# Patient Record
Sex: Male | Born: 1960 | Race: White | Hispanic: No | Marital: Married | Smoking: Never smoker
Health system: Southern US, Community
[De-identification: ages and names within clinical notes are randomized; demographics above are authoritative.]

## PROBLEM LIST (undated history)

## (undated) DIAGNOSIS — I639 Cerebral infarction, unspecified: Secondary | ICD-10-CM

## (undated) DIAGNOSIS — N529 Male erectile dysfunction, unspecified: Secondary | ICD-10-CM

## (undated) DIAGNOSIS — E785 Hyperlipidemia, unspecified: Secondary | ICD-10-CM

## (undated) DIAGNOSIS — F32A Depression, unspecified: Secondary | ICD-10-CM

## (undated) DIAGNOSIS — F419 Anxiety disorder, unspecified: Secondary | ICD-10-CM

## (undated) DIAGNOSIS — I10 Essential (primary) hypertension: Secondary | ICD-10-CM

## (undated) HISTORY — PX: NO PAST SURGERIES: SHX2092

## (undated) HISTORY — DX: Hyperlipidemia, unspecified: E78.5

## (undated) HISTORY — DX: Male erectile dysfunction, unspecified: N52.9

## (undated) HISTORY — DX: Depression, unspecified: F32.A

## (undated) HISTORY — DX: Anxiety disorder, unspecified: F41.9

---

## 2004-08-29 ENCOUNTER — Ambulatory Visit: Payer: Self-pay | Admitting: Gastroenterology

## 2009-10-11 ENCOUNTER — Ambulatory Visit: Payer: Self-pay | Admitting: Gastroenterology

## 2012-05-06 ENCOUNTER — Other Ambulatory Visit: Payer: Self-pay | Admitting: Neurology

## 2012-05-06 LAB — COMPREHENSIVE METABOLIC PANEL
Albumin: 3.6 g/dL (ref 3.4–5.0)
Alkaline Phosphatase: 58 U/L (ref 50–136)
Anion Gap: 6 — ABNORMAL LOW (ref 7–16)
BUN: 17 mg/dL (ref 7–18)
Calcium, Total: 9 mg/dL (ref 8.5–10.1)
Co2: 29 mmol/L (ref 21–32)
Creatinine: 0.84 mg/dL (ref 0.60–1.30)
Glucose: 94 mg/dL (ref 65–99)
Potassium: 5 mmol/L (ref 3.5–5.1)
SGOT(AST): 16 U/L (ref 15–37)
SGPT (ALT): 28 U/L (ref 12–78)
Sodium: 141 mmol/L (ref 136–145)

## 2012-05-12 ENCOUNTER — Ambulatory Visit: Payer: Self-pay | Admitting: Neurology

## 2012-05-14 ENCOUNTER — Ambulatory Visit: Payer: Self-pay | Admitting: Neurology

## 2012-05-20 ENCOUNTER — Ambulatory Visit: Payer: Self-pay | Admitting: Neurology

## 2012-05-20 LAB — CREATININE, SERUM
Creatinine: 0.94 mg/dL (ref 0.60–1.30)
EGFR (Non-African Amer.): >60

## 2014-09-08 ENCOUNTER — Ambulatory Visit: Payer: Self-pay | Admitting: Gastroenterology

## 2016-03-10 ENCOUNTER — Emergency Department: Payer: BLUE CROSS/BLUE SHIELD

## 2016-03-10 ENCOUNTER — Emergency Department
Admission: EM | Admit: 2016-03-10 | Discharge: 2016-03-11 | Disposition: A | Discharge status: Other Acute Inpatient Hospital | Payer: BLUE CROSS/BLUE SHIELD | Attending: Emergency Medicine | Admitting: Emergency Medicine

## 2016-03-10 ENCOUNTER — Encounter: Payer: Self-pay | Admitting: Emergency Medicine

## 2016-03-10 DIAGNOSIS — Z79899 Other long term (current) drug therapy: Secondary | ICD-10-CM | POA: Insufficient documentation

## 2016-03-10 DIAGNOSIS — Z7982 Long term (current) use of aspirin: Secondary | ICD-10-CM | POA: Diagnosis not present

## 2016-03-10 DIAGNOSIS — Z5181 Encounter for therapeutic drug level monitoring: Secondary | ICD-10-CM | POA: Insufficient documentation

## 2016-03-10 DIAGNOSIS — I10 Essential (primary) hypertension: Secondary | ICD-10-CM | POA: Insufficient documentation

## 2016-03-10 DIAGNOSIS — R4182 Altered mental status, unspecified: Secondary | ICD-10-CM | POA: Insufficient documentation

## 2016-03-10 DIAGNOSIS — Z8673 Personal history of transient ischemic attack (TIA), and cerebral infarction without residual deficits: Secondary | ICD-10-CM | POA: Insufficient documentation

## 2016-03-10 DIAGNOSIS — F410 Panic disorder [episodic paroxysmal anxiety] without agoraphobia: Secondary | ICD-10-CM

## 2016-03-10 DIAGNOSIS — F322 Major depressive disorder, single episode, severe without psychotic features: Secondary | ICD-10-CM

## 2016-03-10 HISTORY — DX: Cerebral infarction, unspecified: I63.9

## 2016-03-10 HISTORY — DX: Essential (primary) hypertension: I10

## 2016-03-10 LAB — DIFFERENTIAL
BASOS PCT: 1 %
Basophils Absolute: 0 10*3/uL (ref 0–0.1)
Eosinophils Absolute: 0.1 10*3/uL (ref 0–0.7)
Eosinophils Relative: 1 %
Lymphocytes Relative: 31 %
Lymphs Abs: 2.8 10*3/uL (ref 1.0–3.6)
MONO ABS: 0.8 10*3/uL (ref 0.2–1.0)
Monocytes Relative: 9 %
NEUTROS ABS: 5.3 10*3/uL (ref 1.4–6.5)
NEUTROS PCT: 58 %

## 2016-03-10 LAB — COMPREHENSIVE METABOLIC PANEL
ALBUMIN: 4.4 g/dL (ref 3.5–5.0)
ALT: 43 U/L (ref 17–63)
ANION GAP: 11 (ref 5–15)
AST: 31 U/L (ref 15–41)
Alkaline Phosphatase: 52 U/L (ref 38–126)
BUN: 11 mg/dL (ref 6–20)
CHLORIDE: 104 mmol/L (ref 101–111)
CO2: 25 mmol/L (ref 22–32)
CREATININE: 0.9 mg/dL (ref 0.61–1.24)
Calcium: 9.1 mg/dL (ref 8.9–10.3)
GFR calc non Af Amer: >60 mL/min (ref >60)
Glucose, Bld: 100 mg/dL — ABNORMAL HIGH (ref 65–99)
Potassium: 3.5 mmol/L (ref 3.5–5.1)
SODIUM: 140 mmol/L (ref 135–145)
Total Bilirubin: 1.3 mg/dL — ABNORMAL HIGH (ref 0.3–1.2)
Total Protein: 7.5 g/dL (ref 6.5–8.1)

## 2016-03-10 LAB — URINALYSIS COMPLETE WITH MICROSCOPIC (ARMC ONLY)
BILIRUBIN URINE: NEGATIVE
Bacteria, UA: NONE SEEN
Glucose, UA: NEGATIVE mg/dL
HGB URINE DIPSTICK: NEGATIVE
Nitrite: NEGATIVE
PH: 6 (ref 5.0–8.0)
Protein, ur: NEGATIVE mg/dL
SPECIFIC GRAVITY, URINE: 1.013 (ref 1.005–1.030)

## 2016-03-10 LAB — CBC
HCT: 44.2 % (ref 40.0–52.0)
Hemoglobin: 15.6 g/dL (ref 13.0–18.0)
MCH: 35 pg — ABNORMAL HIGH (ref 26.0–34.0)
MCHC: 35.2 g/dL (ref 32.0–36.0)
MCV: 99.5 fL (ref 80.0–100.0)
PLATELETS: 264 10*3/uL (ref 150–440)
RBC: 4.44 MIL/uL (ref 4.40–5.90)
RDW: 13.1 % (ref 11.5–14.5)
WBC: 9.1 10*3/uL (ref 3.8–10.6)

## 2016-03-10 LAB — TROPONIN I: Troponin I: <0.03 ng/mL (ref <0.03)

## 2016-03-10 LAB — PROTIME-INR
INR: 1.01
PROTHROMBIN TIME: 13.3 s (ref 11.4–15.2)

## 2016-03-10 LAB — GLUCOSE, CAPILLARY: GLUCOSE-CAPILLARY: 101 mg/dL — AB (ref 65–99)

## 2016-03-10 LAB — APTT: APTT: 27 s (ref 24–36)

## 2016-03-10 MED ORDER — ASPIRIN 325 MG PO TABS: 325.0000 mg | ORAL_TABLET | Freq: Every day | ORAL | Status: DC

## 2016-03-10 MED ORDER — HYDROCHLOROTHIAZIDE 12.5 MG PO CAPS
ORAL_CAPSULE | ORAL | Status: AC
Start: 2016-03-10 — End: 2016-03-10
  Administered 2016-03-10: 12.5 mg via ORAL
  Filled 2016-03-10: qty 1

## 2016-03-10 MED ORDER — LISINOPRIL 10 MG PO TABS
10.0000 mg | ORAL_TABLET | Freq: Every day | ORAL | Status: DC
  Administered 2016-03-10: 10 mg via ORAL

## 2016-03-10 MED ORDER — FLUOXETINE HCL 20 MG PO CAPS
20.0000 mg | ORAL_CAPSULE | Freq: Every day | ORAL | Status: DC
  Administered 2016-03-10: 20 mg via ORAL

## 2016-03-10 MED ORDER — LISINOPRIL 10 MG PO TABS
ORAL_TABLET | ORAL | Status: AC
  Administered 2016-03-10: 10 mg via ORAL
  Filled 2016-03-10: qty 1

## 2016-03-10 MED ORDER — ATORVASTATIN CALCIUM 20 MG PO TABS: 20.0000 mg | ORAL_TABLET | Freq: Every day | ORAL | Status: DC

## 2016-03-10 MED ORDER — ASPIRIN EC 325 MG PO TBEC
DELAYED_RELEASE_TABLET | ORAL | Status: AC
  Administered 2016-03-10: 325 mg via ORAL
  Filled 2016-03-10: qty 1

## 2016-03-10 MED ORDER — FLUOXETINE HCL 20 MG PO CAPS
ORAL_CAPSULE | ORAL | Status: AC
Start: 2016-03-10 — End: 2016-03-10
  Administered 2016-03-10: 20 mg via ORAL
  Filled 2016-03-10: qty 1

## 2016-03-10 MED ORDER — LORAZEPAM 2 MG PO TABS: 2.0000 mg | ORAL_TABLET | Freq: Four times a day (QID) | ORAL | Status: DC | PRN

## 2016-03-10 MED ORDER — ASPIRIN EC 325 MG PO TBEC
325.0000 mg | DELAYED_RELEASE_TABLET | Freq: Every day | ORAL | Status: DC
  Administered 2016-03-10: 325 mg via ORAL

## 2016-03-10 MED ORDER — FOSFOMYCIN TROMETHAMINE 3 G PO PACK
3.0000 g | PACK | Freq: Once | ORAL | Status: AC
  Administered 2016-03-10: 3 g via ORAL
  Filled 2016-03-10: qty 3

## 2016-03-10 MED ORDER — HYDROCHLOROTHIAZIDE 12.5 MG PO CAPS
12.5000 mg | ORAL_CAPSULE | Freq: Every day | ORAL | Status: DC
  Administered 2016-03-10: 12.5 mg via ORAL

## 2016-03-10 NOTE — ED Notes (Signed)
Per Family LKW was xcouple days ago but similar symptoms happened xmonth ago

## 2016-03-10 NOTE — ED Notes (Signed)
Pt given urinal for UA before leaving for MRI, pt unable to urinate at this time

## 2016-03-10 NOTE — ED Notes (Signed)
Patient transported to MRI 

## 2016-03-10 NOTE — ED Notes (Addendum)
Pt states he is feeling better. 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 He denies SI/HI at this time.

## 2016-03-10 NOTE — BH Assessment (Signed)
Tele Assessment Note   Daniel Warren is an 55 y.o. male, Caucasian, Married who presents to [facility]  for altered mental status, per family pt has been disoriented , syncopa xcouple days. Pt has baseline left side deficits from previous stroke but increased weakness to left side. Patient states that his primary complaint is of weakness and heart racing which he states may be anxiety/ panic attacks, but unsure.Patinet states that when he gets mad or angry he gets weak and "blacks out." However, pt denies hx. Of anger or violence. 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 Patient states that he has had no change in sleep and regularly sleeps from 4-5 hours per night as norms for farm work per patient report. Patient denies any hx. Of psychotic symptoms.  Patient denies current or past SI/HI and AVH as well as substance abuse. Patient denies any hx. Of inpatient or outpatient psychiatric care.    Patient is dressed in scrubs and is alert and oriented x4. Patient speech was within normal limits and motor behavior appeared normal. Patient thought process is coherent. Patient  does not appear to be responding to internal stimuli. Patient was cooperative throughout the assessment.   Diagnosis: Unspecified Anxiety Disorder  Past Medical History:  Past Medical History:  Diagnosis Date  . Hypertension   . Stroke [facility])     History reviewed. No pertinent surgical history.  Family History: History reviewed. No pertinent family history.  Social History:  reports that he has never smoked. He has never used smokeless tobacco. He reports that he does not drink alcohol or use drugs.  Additional Social History:     CIWA: CIWA-Ar BP: 122/84 Pulse Rate: 78 COWS:    PATIENT STRENGTHS: (choose at least two) Active sense of humor Average or above average  intelligence Capable of independent living  Allergies:  Allergies  Allergen Reactions  . Percocet [Oxycodone-Acetaminophen] Other (See Comments)    hallucinations  . Codeine Rash and Other (See Comments)    hallucinations    Home Medications:  (Not in a hospital admission)  OB/GYN Status:  No LMP for male patient.  General Assessment Data Location of Assessment: [facility] ED TTS Assessment: In system Is this a Tele or Face-to-Face Assessment?: Face-to-Face Is this an Initial Assessment or a Re-assessment for this encounter?: Initial Assessment Marital status: Married Daniel Warren name: n/a Is patient pregnant?: No Pregnancy Status: No Living Arrangements: Spouse/significant other Can pt return to current living arrangement?: Yes Admission Status: Voluntary Is patient capable of signing voluntary admission?: Yes Referral Source: Self/Family/Friend Insurance type: Nurse, adult Exam [facility] Walk-in ONLY) Medical Exam completed: Yes  Crisis Care Plan Living Arrangements: Spouse/significant other Legal Guardian: Other: (none) Name of Psychiatrist: none Name of Therapist: none  Education Status Is patient currently in school?: No Current Grade: n/a Highest grade of school patient has completed: unspecified Name of school: unspecified Contact person: none given  Risk to self with the past 6 months Suicidal Ideation: No Has patient been a risk to self within the past 6 months prior to admission? : No Suicidal Intent: No Has patient had any suicidal intent within the past 6 months prior to admission? : No Is patient at risk for suicide?: No Suicidal Plan?: No Has patient had any suicidal plan within the past 6 months prior to  admission? : No Access to Means: No What has been your use of drugs/alcohol within the last 12 months?: none Previous Attempts/Gestures: No How many times?: 0 Other Self Harm Risks: none noted Triggers for Past Attempts: None  known Intentional Self Injurious Behavior: None Family Suicide History: No Recent stressful life event(s): Trauma (Comment) (multiple deaths in family) Persecutory voices/beliefs?: No Depression: Yes Depression Symptoms: Despondent, Insomnia, Tearfulness, Fatigue, Guilt Substance abuse history and/or treatment for substance abuse?: No Suicide prevention information given to non-admitted patients: Not applicable  Risk to Others within the past 6 months Homicidal Ideation: No Does patient have any lifetime risk of violence toward others beyond the six months prior to admission? : No Thoughts of Harm to Others: No Current Homicidal Intent: No Current Homicidal Plan: No Access to Homicidal Means: No Identified Victim: none History of harm to others?: No Assessment of Violence: None Noted Violent Behavior Description: none Does patient have access to weapons?: Yes (Comment) (pt lives on farm has guns to protect against wild animals) Criminal Charges Pending?: No Does patient have a court date: No Is patient on probation?: No  Psychosis Hallucinations: None noted Delusions: None noted  Mental Status Report Appearance/Hygiene: In scrubs Eye Contact: Good Motor Activity: Unremarkable Speech: Unremarkable Level of Consciousness: Alert Mood: Anxious Affect: Appropriate to circumstance Anxiety Level: Panic Attacks Panic attack frequency:  (random) Most recent panic attack: 03/10/16 Thought Processes: Coherent, Relevant Judgement: Unimpaired Orientation: Person, Place, Time, Situation, Appropriate for developmental age Obsessive Compulsive Thoughts/Behaviors: None  Cognitive Functioning Concentration: Normal Memory: Recent Intact, Remote Intact IQ: Average Insight: Good Impulse Control: Good Appetite: Fair Weight Loss: 0 Weight Gain: 0 Sleep: No Change Total Hours of Sleep:  (4-6) Vegetative Symptoms: None  ADLScreening [facility]) Patient's cognitive  ability adequate to safely complete daily activities?: Yes Patient able to express need for assistance with ADLs?: Yes Independently performs ADLs?: Yes (appropriate for developmental age)  Prior Inpatient Therapy Prior Inpatient Therapy: No Prior Therapy Dates: n/a Prior Therapy Facilty/Provider(s): n/a Reason for Treatment: n/a  Prior Outpatient Therapy Prior Outpatient Therapy: No Prior Therapy Dates: n/a Prior Therapy Facilty/Provider(s): n/a Reason for Treatment: n/a Does patient have an ACCT team?: No Does patient have Intensive In-House Services?  : No Does patient have [facility] services? : No Does patient have P4CC services?: No  ADL Screening (condition at time of admission) Patient's cognitive ability adequate to safely complete daily activities?: Yes Is the patient deaf or have difficulty hearing?: No Does the patient have difficulty seeing, even when wearing glasses/contacts?: No Does the patient have difficulty concentrating, remembering, or making decisions?: No Patient able to express need for assistance with ADLs?: Yes Does the patient have difficulty dressing or bathing?: No Independently performs ADLs?: Yes (appropriate for developmental age) Does the patient have difficulty walking or climbing stairs?: No Weakness of Legs: None Weakness of Arms/Hands: None       Abuse/Neglect Assessment (Assessment to be complete while patient is alone) Physical Abuse: Denies Verbal Abuse: Denies Sexual Abuse: Denies Exploitation of patient/patient's resources: Denies Self-Neglect: Denies Values / Beliefs Cultural Requests During Hospitalization: None Spiritual Requests During Hospitalization: None   Advance Directives (For Healthcare) Does patient have an advance directive?: No Would patient like information on creating an advanced directive?: No - patient declined information    Additional Information 1:1 In Past 12 Months?: No CIRT Risk: No Elopement Risk:  No Does patient have medical clearance?: Yes     Disposition:  Disposition Initial Assessment Completed for this  Encounter: Yes Disposition of Patient: Other dispositions (TBD upon psych consult)  Hipolito Bayley 03/10/2016 2:46 PM

## 2016-03-10 NOTE — ED Triage Notes (Signed)
Pt to ED via POV for altered mental status, per family pt has been disoriented , syncopa xcouple days. Pt has baseline left side deficits from previous stroke but increased weakness to left side. . Pt c/o left sided headache. Pt A&O to self and time.

## 2016-03-10 NOTE — Progress Notes (Signed)
Patient is to be admitted to [facility] by Dr. Toni Amend.  Attending Physician will be Dr. Ardyth Harps.   Patient has been assigned to room 304, by [facility] Charge Nurse Marylu Lund.   Intake Paper Work has been signed and placed on patient chart.  ER staff is aware of the admission ( [facility].; Dr. Don Perking, ER MD; [place] Patient's Nurse & [facility]essel[facility] Patient Access). Peggie Hornak K. Sherlon Handing, LPC-A, [facility]  Counselor 03/10/2016 6:34 PM

## 2016-03-10 NOTE — Consult Note (Signed)
[facility] Consult   Reason for Consult:  This is a consult for 55 year old man with a history of anxiety brought into the hospital with what sounds like panic attack Referring Physician:  Alfred Levins Patient Identification: LORIMER TIBERIO MRN:  [id number] Principal Diagnosis: Severe major depression, single episode, without psychotic features [facility]) Diagnosis:   Patient Active Problem List   Diagnosis Date Noted  . Severe major depression, single episode, without psychotic features ([place]) [F32.2] 03/10/2016  . Panic attacks [F41.0] 03/10/2016  . History of stroke [Z86.73] 03/10/2016  . Hypertension [I10] 03/10/2016  . Suicidal behavior [F48.9] 03/10/2016    Total Time spent with patient: 1 hour  Subjective:   Daniel Warren is a 55 y.o. male patient admitted with "I reckon anxiety".  HPI:  Patient interviewed. Chart reviewed. Labs and vitals reviewed. Commitment paperwork reviewed. This 55 year old man was brought into the hospital after having some sort of breakdown last night. On history the patient describes it to me that he was out in the barn when he started to feel strange and like he was falling apart. He managed to drive his truck back to the house but his left side was feeling weak in his heart was pounding. Family brought him into the hospital with concerns that he might have had a stroke. According to the commitment paper however the patient also pulled a gun out of the closet and was pointing it at himself last night. When I confronted him with this information he admitted that it was true but defended himself by saying the gun was not loaded. I ask him repeatedly if he could explain the situation to me and he told me "just wasn't thinking". Patient admits that he's been feeling overwhelmed recently. He has financial problems and also a lot of worry about his extended family. His sleep has been interrupted frequently. 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 His appetite apparently has been okay. This is not the first anxiety attack-like situation he has had this week in fact it's the third or fourth. Mood is been getting more stressed out recently. Denies having any hallucinations. He is not currently getting any kind of psychiatric medicine. He denies the abuse of alcohol or drugs.  Social history: Patient lives with his wife and 2 adult sons. 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 Feels very stressed by this and also by worries about his mother-in-law.  Medical history: History of atherosclerotic disease and had a stroke documented about 4 years ago. Ever since then he has felt anxious and worried about his health. History of hypertension.  Substance abuse history: Denies alcohol or drug abuse denies any past history of alcohol and drug abuse. Eventually he relents and admits that he drinks about 2-3 beers a day although he says it's rarely anymore than that and he insists that it's never been a problem.  Past Psychiatric History: Never seen a psychiatrist. Never been prescribed any medicine for mental health conditions. He denies any suicide attempts although the commitment paperwork states this is the second time in the last month he has pulled a gun out on himself.  Risk to Self: Suicidal Ideation: No Suicidal Intent: No Is patient at risk for suicide?: No Suicidal Plan?: No Access to Means: No What has been your use of  drugs/alcohol within the last 12 months?: none How many times?: 0 Other Self Harm Risks: none noted Triggers for Past Attempts: None known Intentional Self Injurious Behavior: None Risk to Others: Homicidal Ideation: No Thoughts of Harm to Others: No Current Homicidal Intent: No Current Homicidal Plan: No Access to Homicidal Means: No Identified Victim: none History of harm to others?:  No Assessment of Violence: None Noted Violent Behavior Description: none Does patient have access to weapons?: Yes (Comment) (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  : No Does patient have [facility] services? : No Does patient have P4CC services?: No  Past Medical History:  Past Medical History:  Diagnosis Date  . Hypertension   . Stroke [facility])    History reviewed. No pertinent surgical history. Family History: History reviewed. No pertinent family history. Family Psychiatric  History: Patient denies knowing of any family history of mental health problems at all. Social History:  History  Alcohol Use No     History  Drug Use No    Social History   Social History  . Marital status: Married    Spouse name: N/A  . Number of children: N/A  . Years of education: N/A   Social History Main Topics  . Smoking status: Never Smoker  . Smokeless tobacco: Never Used  . Alcohol use No  . Drug use: No  . Sexual activity: Not Asked   Other Topics Concern  . None   Social History Narrative  . None   Additional Social History:    Allergies:   Allergies  Allergen Reactions  . Percocet [Oxycodone-Acetaminophen] Other (See Comments)    hallucinations  . Codeine Rash and Other (See Comments)    hallucinations    Labs:  Results for orders placed or performed during the hospital encounter of 03/10/16 (from the past 48 hour(s))  Protime-INR     Status: None   Collection Time: 03/10/16  8:14 AM  Result Value Ref Range    Prothrombin Time 13.3 11.4 - 15.2 seconds   INR 1.01   APTT     Status: None   Collection Time: 03/10/16  8:14 AM  Result Value Ref Range   aPTT 27 24 - 36 seconds  CBC     Status: Abnormal   Collection Time: 03/10/16  8:14 AM  Result Value Ref Range   WBC 9.1 3.8 - 10.6 K/uL   RBC 4.44 4.40 - 5.90 MIL/uL   Hemoglobin 15.6 13.0 - 18.0 g/dL   HCT 44.2 40.0 - 52.0 %   MCV 99.5 80.0 - 100.0 fL   MCH 35.0 (H) 26.0 - 34.0 pg   MCHC 35.2 32.0 - 36.0 g/dL   RDW 13.1 11.5 - 14.5 %   Platelets 264 150 - 440 K/uL  Differential     Status: None   Collection Time: 03/10/16  8:14 AM  Result Value Ref Range   Neutrophils Relative % 58 %   Neutro Abs 5.3 1.4 - 6.5 K/uL   Lymphocytes Relative 31 %   Lymphs Abs 2.8 1.0 - 3.6 K/uL   Monocytes Relative 9 %  Monocytes Absolute 0.8 0.2 - 1.0 K/uL   Eosinophils Relative 1 %   Eosinophils Absolute 0.1 0 - 0.7 K/uL   Basophils Relative 1 %   Basophils Absolute 0.0 0 - 0.1 K/uL  Comprehensive metabolic panel     Status: Abnormal   Collection Time: 03/10/16  8:14 AM  Result Value Ref Range   Sodium 140 135 - 145 mmol/L   Potassium 3.5 3.5 - 5.1 mmol/L   Chloride 104 101 - 111 mmol/L   CO2 25 22 - 32 mmol/L   Glucose, Bld 100 (H) 65 - 99 mg/dL   BUN 11 6 - 20 mg/dL   Creatinine, Ser 0.90 0.61 - 1.24 mg/dL   Calcium 9.1 8.9 - 10.3 mg/dL   Total Protein 7.5 6.5 - 8.1 g/dL   Albumin 4.4 3.5 - 5.0 g/dL   AST 31 15 - 41 U/L   ALT 43 17 - 63 U/L   Alkaline Phosphatase 52 38 - 126 U/L   Total Bilirubin 1.3 (H) 0.3 - 1.2 mg/dL   GFR calc non Af Amer >60 >60 mL/min   GFR calc Af Amer >60 >60 mL/min    Comment: (NOTE) The eGFR has been calculated using the CKD EPI equation. This calculation has not been validated in all clinical situations. eGFR's persistently <60 mL/min signify possible Chronic Kidney Disease.    Anion gap 11 5 - 15  Troponin I     Status: None   Collection Time: 03/10/16  8:14 AM  Result Value Ref Range   Troponin I <0.03  <0.03 ng/mL  Glucose, capillary     Status: Abnormal   Collection Time: 03/10/16  8:21 AM  Result Value Ref Range   Glucose-Capillary 101 (H) 65 - 99 mg/dL  Urinalysis complete, with microscopic ([facility] only)     Status: Abnormal   Collection Time: 03/10/16 11:21 AM  Result Value Ref Range   Color, Urine YELLOW (A) YELLOW   APPearance CLEAR (A) CLEAR   Glucose, UA NEGATIVE NEGATIVE mg/dL   Bilirubin Urine NEGATIVE NEGATIVE   Ketones, ur TRACE (A) NEGATIVE mg/dL   Specific Gravity, Urine 1.013 1.005 - 1.030   Hgb urine dipstick NEGATIVE NEGATIVE   pH 6.0 5.0 - 8.0   Protein, ur NEGATIVE NEGATIVE mg/dL   Nitrite NEGATIVE NEGATIVE   Leukocytes, UA 1+ (A) NEGATIVE   RBC / HPF 0-5 0 - 5 RBC/hpf   WBC, UA 6-30 0 - 5 WBC/hpf   Bacteria, UA NONE SEEN NONE SEEN   Squamous Epithelial / LPF 0-5 (A) NONE SEEN   Mucous PRESENT     Current Facility-Administered Medications  Medication Dose Route Frequency Provider Last Rate Last Dose  . aspirin tablet 325 mg  325 mg Oral Daily Gonzella Lex, MD      . Derrill Memo ON 03/11/2016] atorvastatin (LIPITOR) tablet 20 mg  20 mg Oral q1800 Gonzella Lex, MD      . FLUoxetine (PROZAC) capsule 20 mg  20 mg Oral Daily Pegge Cumberledge T Elsy Chiang, MD      . hydrochlorothiazide (MICROZIDE) capsule 12.5 mg  12.5 mg Oral Daily Allyse Fregeau T Bruna Dills, MD      . lisinopril (PRINIVIL,ZESTRIL) tablet 10 mg  10 mg Oral Daily Gonzella Lex, MD      . LORazepam (ATIVAN) tablet 2 mg  2 mg Oral Q6H PRN Gonzella Lex, MD       Current Outpatient Prescriptions  Medication Sig Dispense Refill  . aspirin (GOODSENSE ASPIRIN) 325  MG tablet Take 325 mg by mouth daily.    Marland Kitchen atorvastatin (LIPITOR) 20 MG tablet Take 20 mg by mouth daily.    Marland Kitchen lisinopril-hydrochlorothiazide (PRINZIDE,ZESTORETIC) 10-12.5 MG tablet Take 1 tablet by mouth daily.    Marland Kitchen Ubiquinol 100 MG CAPS Take 1 capsule by mouth daily.       Musculoskeletal: Strength & Muscle Tone: within normal limits Gait & Station:  normal Patient leans: N/A  Psychiatric Specialty Exam: Physical Exam  Nursing note and vitals reviewed. Constitutional: He appears well-developed and well-nourished.  HENT:  Head: Normocephalic and atraumatic.  Eyes: Conjunctivae are normal. Pupils are equal, round, and reactive to light.  Neck: Normal range of motion.  Cardiovascular: Regular rhythm and normal heart sounds.   Respiratory: Effort normal. No respiratory distress.  GI: Soft.  Musculoskeletal: Normal range of motion.  Neurological: He is alert.  Skin: Skin is warm and dry.  Psychiatric: His mood appears anxious. His speech is delayed. He is slowed. He expresses inappropriate judgment. He exhibits a depressed mood. He expresses suicidal ideation. He exhibits normal recent memory.    Review of Systems  Constitutional: Negative.   HENT: Negative.   Eyes: Negative.   Respiratory: Negative.   Cardiovascular: Negative.   Gastrointestinal: Negative.   Musculoskeletal: Negative.   Skin: Negative.   Neurological: Negative.   Psychiatric/Behavioral: Positive for depression and suicidal ideas. Negative for hallucinations, memory loss and substance abuse. The patient is nervous/anxious and has insomnia.     Blood pressure 122/84, pulse 78, temperature 98 F (36.7 C), temperature source Oral, resp. rate 19, height 5' 9"  (1.753 m), weight 86.2 kg (190 lb), SpO2 96 %.Body mass index is 28.06 kg/m.  General Appearance: Casual  Eye Contact:  Fair  Speech:  Clear and Coherent  Volume:  Normal  Mood:  Anxious and Depressed  Affect:  Constricted  Thought Process:  Disorganized  Orientation:  Full (Time, Place, and Person)  Thought Content:  Illogical  Suicidal Thoughts:  Yes.  without intent/plan  Homicidal Thoughts:  No  Memory:  Immediate;   Good Recent;   Fair Remote;   Fair  Judgement:  Impaired  Insight:  Shallow  Psychomotor Activity:  Decreased  Concentration:  Concentration: Poor  Recall:  [employer] of  Knowledge:  Fair  Language:  Fair  Akathisia:  No  Handed:  Right  AIMS (if indicated):     Assets:  Financial Resources/Insurance Housing Social Support Talents/Skills  ADL's:  Intact  Cognition:  WNL  Sleep:        Treatment Plan Summary: Daily contact with patient to assess and evaluate symptoms and progress in treatment, Medication management and Plan 55 year old man who came in with panic attack. Initially he did not report the more significant symptom of pulling out a gun on himself. When confronted however he admits it. Patient clearly is very distressed. He is showing poor insight and judgment regarding his mental health situation. Very nervous. Appears to be having a significant major depression as well as panic attacks. The 2 episodes of putting his hands on a gun in an emotional situation vastly increases my concern of dangerousness. Patient will be admitted to the psychiatric ward. Ativan as needed for anxiety. Initiate Prozac for anxiety disorder and depression. Supportive counseling. 15 minute checks. Case reviewed with the ER doctor.  Disposition: Recommend psychiatric Inpatient admission when medically cleared. Supportive therapy provided about ongoing stressors.  Alethia Berthold, MD 03/10/2016 6:17 PM
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
## 2016-03-11 ENCOUNTER — Inpatient Hospital Stay
Admission: EM | Admit: 2016-03-11 | Discharge: 2016-03-14 | DRG: 885 | Disposition: A | Discharge status: Home / Self Care | Payer: BLUE CROSS/BLUE SHIELD | Source: Intra-hospital | Attending: Psychiatry | Admitting: Psychiatry

## 2016-03-11 DIAGNOSIS — N390 Urinary tract infection, site not specified: Secondary | ICD-10-CM | POA: Diagnosis present

## 2016-03-11 DIAGNOSIS — I679 Cerebrovascular disease, unspecified: Secondary | ICD-10-CM

## 2016-03-11 DIAGNOSIS — Z79899 Other long term (current) drug therapy: Secondary | ICD-10-CM | POA: Diagnosis not present

## 2016-03-11 DIAGNOSIS — I10 Essential (primary) hypertension: Secondary | ICD-10-CM | POA: Diagnosis present

## 2016-03-11 DIAGNOSIS — G4700 Insomnia, unspecified: Secondary | ICD-10-CM | POA: Diagnosis present

## 2016-03-11 DIAGNOSIS — F410 Panic disorder [episodic paroxysmal anxiety] without agoraphobia: Secondary | ICD-10-CM | POA: Diagnosis present

## 2016-03-11 DIAGNOSIS — Z888 Allergy status to other drugs, medicaments and biological substances status: Secondary | ICD-10-CM

## 2016-03-11 DIAGNOSIS — F17220 Nicotine dependence, chewing tobacco, uncomplicated: Secondary | ICD-10-CM | POA: Diagnosis present

## 2016-03-11 DIAGNOSIS — Z7982 Long term (current) use of aspirin: Secondary | ICD-10-CM

## 2016-03-11 DIAGNOSIS — F322 Major depressive disorder, single episode, severe without psychotic features: Secondary | ICD-10-CM | POA: Diagnosis present

## 2016-03-11 DIAGNOSIS — Z820 Family history of epilepsy and other diseases of the nervous system: Secondary | ICD-10-CM

## 2016-03-11 DIAGNOSIS — E785 Hyperlipidemia, unspecified: Secondary | ICD-10-CM

## 2016-03-11 DIAGNOSIS — I69354 Hemiplegia and hemiparesis following cerebral infarction affecting left non-dominant side: Secondary | ICD-10-CM | POA: Diagnosis not present

## 2016-03-11 DIAGNOSIS — Z886 Allergy status to analgesic agent status: Secondary | ICD-10-CM

## 2016-03-11 LAB — URINE DRUG SCREEN, QUALITATIVE (ARMC ONLY)
Amphetamines, Ur Screen: NOT DETECTED
BARBITURATES, UR SCREEN: NOT DETECTED
BENZODIAZEPINE, UR SCRN: NOT DETECTED
Cannabinoid 50 Ng, Ur ~~LOC~~: NOT DETECTED
Cocaine Metabolite,Ur ~~LOC~~: NOT DETECTED
MDMA (Ecstasy)Ur Screen: NOT DETECTED
METHADONE SCREEN, URINE: NOT DETECTED
OPIATE, UR SCREEN: NOT DETECTED
Phencyclidine (PCP) Ur S: NOT DETECTED
TRICYCLIC, UR SCREEN: NOT DETECTED

## 2016-03-11 LAB — TSH: TSH: 1.727 u[IU]/mL (ref 0.350–4.500)

## 2016-03-11 LAB — VITAMIN B12: Vitamin B-12: 284 pg/mL (ref 180–914)

## 2016-03-11 MED ORDER — ATORVASTATIN CALCIUM 20 MG PO TABS
20.0000 mg | ORAL_TABLET | Freq: Every day | ORAL | Status: DC
  Administered 2016-03-11 – 2016-03-14 (×4): 20 mg via ORAL
  Filled 2016-03-11 (×4): qty 1

## 2016-03-11 MED ORDER — LISINOPRIL 10 MG PO TABS
10.0000 mg | ORAL_TABLET | Freq: Every day | ORAL | Status: DC
  Administered 2016-03-11 – 2016-03-14 (×4): 10 mg via ORAL
  Filled 2016-03-11 (×4): qty 1

## 2016-03-11 MED ORDER — ACETAMINOPHEN 325 MG PO TABS: 650.0000 mg | ORAL_TABLET | Freq: Four times a day (QID) | ORAL | Status: DC | PRN

## 2016-03-11 MED ORDER — MAGNESIUM HYDROXIDE 400 MG/5ML PO SUSP: 30.0000 mL | Freq: Every day | ORAL | Status: DC | PRN

## 2016-03-11 MED ORDER — TRAZODONE HCL 50 MG PO TABS
50.0000 mg | ORAL_TABLET | Freq: Every day | ORAL | Status: DC
  Administered 2016-03-11: 50 mg via ORAL
  Filled 2016-03-11: qty 1

## 2016-03-11 MED ORDER — UBIQUINOL 100 MG PO CAPS: 1.0000 | ORAL_CAPSULE | Freq: Every day | ORAL | Status: DC

## 2016-03-11 MED ORDER — ASPIRIN 325 MG PO TABS
325.0000 mg | ORAL_TABLET | Freq: Every day | ORAL | Status: DC
  Administered 2016-03-11 – 2016-03-14 (×4): 325 mg via ORAL
  Filled 2016-03-11 (×5): qty 1

## 2016-03-11 MED ORDER — LISINOPRIL-HYDROCHLOROTHIAZIDE 10-12.5 MG PO TABS: 1.0000 | ORAL_TABLET | Freq: Every day | ORAL | Status: DC

## 2016-03-11 MED ORDER — HYDROCHLOROTHIAZIDE 12.5 MG PO CAPS
12.5000 mg | ORAL_CAPSULE | Freq: Every day | ORAL | Status: DC
  Administered 2016-03-11 – 2016-03-14 (×4): 12.5 mg via ORAL
  Filled 2016-03-11 (×4): qty 1

## 2016-03-11 MED ORDER — FLUOXETINE HCL 10 MG PO CAPS
10.0000 mg | ORAL_CAPSULE | Freq: Every day | ORAL | Status: DC
  Administered 2016-03-11 – 2016-03-14 (×4): 10 mg via ORAL
  Filled 2016-03-11 (×4): qty 1

## 2016-03-11 MED ORDER — ALUM & MAG HYDROXIDE-SIMETH 200-200-20 MG/5ML PO SUSP: 30.0000 mL | ORAL | Status: DC | PRN

## 2016-03-11 NOTE — Progress Notes (Signed)
Recreation Therapy Notes  Date: 08.01.17 Time: 9:30 am Location: Craft Room  Group Topic: Self-expression  Goal Area(s) Addresses:  Patient will identify one color per emotion listed on wheel. Patient will verbalize benefit of using art as a means of self-expression. Patient will verbalize one emotion experienced during session. Patient will be educated on other forms of self-expression.  Behavioral Response: Arrived late, Attentive, Interactive  Intervention: Emotion Wheel  Activity: Patients were given an Arboriculturist and instructed to pick a color for each emotion that was listed on the wheel.  Education: LRT educated patients on other forms of self-expression.  Education Outcome: Acknowledges education/In group clarification offered  Clinical Observations/Feedback: Patient arrived to group at approximately 10:05 am. Patient contributed to group discussion by stating ways he expresses his emotions.  Jacquelynn Cree, LRT/CTRS 03/11/2016 10:24 AM

## 2016-03-11 NOTE — [facility] Suicide Risk Assessment (Signed)
[facility] Admission Suicide Risk Assessment   Nursing information obtained from:  Patient Demographic factors:  Male, Caucasian, Access to firearms Current Mental Status:  Suicidal ideation indicated by patient, Self-harm thoughts Loss Factors:  NA Historical Factors:  NA Risk Reduction Factors:  Religious beliefs about death, Employed, Living with another person, especially a relative, Positive social support  Total Time spent with patient: 1 hour Principal Problem: Severe major depression, single episode, without psychotic features (HCC) Diagnosis:   Patient Active Problem List   Diagnosis Date Noted  . CVD (cerebrovascular disease) [I67.9] 03/11/2016  . Dyslipidemia [E78.5] 03/11/2016  . Severe major depression, single episode, without psychotic features (HCC) [F32.2] 03/10/2016  . Panic attacks [F41.0] 03/10/2016  . Hypertension [I10] 03/10/2016   Subjective Data:   Continued Clinical Symptoms:  Alcohol Use Disorder Identification Test Final Score (AUDIT): 5 The "Alcohol Use Disorders Identification Test", Guidelines for Use in Primary Care, Second Edition.  World Science writer [facility]). Score between 0-7:  no or low risk or alcohol related problems. Score between 8-15:  moderate risk of alcohol related problems. Score between 16-19:  high risk of alcohol related problems. Score 20 or above:  warrants further diagnostic evaluation for alcohol dependence and treatment.   CLINICAL FACTORS:   Severe Anxiety and/or Agitation Depression:   Severe More than one psychiatric diagnosis Medical Diagnoses and Treatments/Surgeries     Psychiatric Specialty Exam: Physical Exam  ROS  Blood pressure (!) 144/89, pulse 73, temperature 98.6 F (37 C), temperature source Oral, resp. rate 16, height 5\' 9"  (1.753 m), weight 83.9 kg (185 lb), SpO2 99 %.Body mass index is 27.32 kg/m.                                                          COGNITIVE FEATURES  THAT CONTRIBUTE TO RISK:  None    SUICIDE RISK:   Moderate:  Frequent suicidal ideation with limited intensity, and duration, some specificity in terms of plans, no associated intent, good self-control, limited dysphoria/symptomatology, some risk factors present, and identifiable protective factors, including available and accessible social support.   PLAN OF CARE: admit to bh  I certify that inpatient services furnished can reasonably be expected to improve the patient's condition.  Jimmy Footman, MD 03/11/2016, 9:28 AM

## 2016-03-11 NOTE — [facility] Group Notes (Signed)
[facility] Group Notes:  (Nursing/MHT/Case Management/Adjunct)  Date:  03/11/2016  Time:  3:33 PM  Type of Therapy:  Psychoeducational Skills  Participation Level:  Active  Participation Quality:  Appropriate, Sharing and Supportive  Affect:  Appropriate  Cognitive:  Appropriate  Insight:  Appropriate  Engagement in Group:  Engaged and Supportive  Modes of Intervention:  Discussion and Education  Summary of Progress/Problems:  Daniel Warren 03/11/2016, 3:33 PM

## 2016-03-11 NOTE — Plan of Care (Signed)
Problem: Activity: Goal: Interest or engagement in activities will improve Outcome: Progressing Discussed ward routine and medications. Patient verbalized understanding.

## 2016-03-11 NOTE — [facility] Suicide Risk Assessment (Addendum)
[facility] INPATIENT:  Family/Significant Other Suicide Prevention Education  Suicide Prevention Education:  Education Completed; Son, Juanalberto Abadi and wife, Dayton Martes ph#: [phone] has been identified by the patient as the family member/significant other with whom the patient will be residing, and identified as the person(s) who will aid the patient in the event of a mental health crisis (suicidal ideations/suicide attempt).  With written consent from the patient, the family member/significant other has been provided the following suicide prevention education, prior to the and/or following the discharge of the patient. Wife expressed concern about patient's suicidal ideation as it has progressed overtime. CSW and MD spoke with family about suicidal prevention and securing weapons. Family expressed understanding and will secure all weapons.  The suicide prevention education provided includes the following:  Suicide risk factors  Suicide prevention and interventions  National Suicide Hotline telephone number  [facility] assessment telephone number  [facility] Emergency Assistance 911  [facility] and/or Residential Mobile Crisis Unit telephone number  Request made of family/significant other to:  Remove weapons (e.g., guns, rifles, knives), all items previously/currently identified as safety concern.    Remove drugs/medications (over-the-counter, prescriptions, illicit drugs), all items previously/currently identified as a safety concern.  The family member/significant other verbalizes understanding of the suicide prevention education information provided.  The family member/significant other agrees to remove the items of safety concern listed above.  Lynden Oxford, MSW, LCSW-A 03/11/2016, 9:59 AM

## 2016-03-11 NOTE — Progress Notes (Signed)
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  Leisure Interests (2+):  Individual - Other (Comment), Music - Play instrument (Play with dogs)  Awareness of Community Resources:  Yes  Community Resources:  Park, Other (Comment) (Trails)  Current Use: Yes  If no, Barriers?:    Patient Strengths:  Funny, loving  Patient Identified Areas of Improvement:  All of them  Current Recreation Participation:  Going out with eat with family, spending time with friends, driving dogs in truck  Patient Goal for Hospitalization:  To get himself home and get back to his life  [place] of Residence:  [place] of Residence:  [place]   Current SI (including self-harm):  No  Current HI:  No  Consent to Intern Participation: N/A  Due to patient reporting no personal challenges, LRT will not develop a Recreational Therapy Care Plan at this time. If patient's status changes, LRT will develop a Recreational Therapy Care Plan.  Jacquelynn Cree, LRT/CTRS Mar 13, 2016, 2:51 PM

## 2016-03-11 NOTE — Progress Notes (Signed)
Patient states that he had recently been stressed and he felt like he was having a panic attack. Patient states that he was stupid to take a gun to himself. He says that he wasn't going to do anything. Denies SI/HI/AVH and contracts for safety. His goal today is to go home back to his normal life. Patient cooperative, medication compliant and attended groups. Patient was encouraged to report feelings/thoughts/concerns to nursing staff. He verbalized understanding.

## 2016-03-11 NOTE — Tx Team (Signed)
Initial Interdisciplinary Treatment Plan   PATIENT STRESSORS: Financial difficulties Substance abuse   PATIENT STRENGTHS: Ability for insight Communication skills General fund of knowledge Motivation for treatment/growth Religious Affiliation Supportive family/friends   PROBLEM LIST: Problem List/Patient Goals Date to be addressed Date deferred Reason deferred Estimated date of resolution  "I don't want to keep having these attacks." 03/11/16     Panic attacks 03/11/16     Anxiety  03/11/16                                          DISCHARGE CRITERIA:  Ability to meet basic life and health needs  PRELIMINARY DISCHARGE PLAN: Outpatient therapy  PATIENT/FAMIILY INVOLVEMENT: This treatment plan has been presented to and reviewed with the patient, Daniel Warren, and/or family member.  The patient and family have been given the opportunity to ask questions and make suggestions.  Lendell Caprice 03/11/2016, 4:49 AM

## 2016-03-11 NOTE — Progress Notes (Signed)
Mostly in room resting, pleasant on approach, A&Ox3, denied pain, denied SI/HI, denied AV/H. "I thought I was having a stroke again when I was at home, and my wife brought me here ..... I didn't sleep much last night ...hope to sleep better today.." Denied and attempted suicide by fire arms per chart review, evasive and did not want to continue conversation. Initially decline Trazodone 50 mg at bedtime but received medication after explanation/education was provided.
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
## 2016-03-11 NOTE — [facility] Group Notes (Signed)
Goals Group Date/Time: 03/11/2016 9:00 AM Type of Therapy and Topic: Group Therapy: Goals Group: SMART Goals   Participation Level: Moderate  Description of Group:    The purpose of a daily goals group is to assist and guide patients in setting recovery/wellness-related goals. The objective is to set goals as they relate to the crisis in which they were admitted. Patients will be using SMART goal modalities to set measurable goals. Characteristics of realistic goals will be discussed and patients will be assisted in setting and processing how one will reach their goal. Facilitator will also assist patients in applying interventions and coping skills learned in psycho-education groups to the SMART goal and process how one will achieve defined goal.   Therapeutic Goals:   -Patients will develop and document one goal related to or their crisis in which brought them into treatment.  -Patients will be guided by LCSW using SMART goal setting modality in how to set a measurable, attainable, realistic and time sensitive goal.  -Patients will process barriers in reaching goal.  -Patients will process interventions in how to overcome and successful in reaching goal.   Patient's Goal: Pt shared that the pt's primary goal is to "get better".  After the pt's initial share the pt did not share at length, but did respond to prompts from the CSW with yes or no answers.  Pt was a vague historian. Pt was polite and cooperative with the CSW and other group members and focused and attentive to the topics discussed and the sharing of others.     Therapeutic Modalities:  Motivational Interviewing  Research officer, political party  SMART goals setting   Dorothe Pea. Leigh Kaeding, LCSWA, LCAS

## 2016-03-11 NOTE — Progress Notes (Signed)
PHARMACIST - PHYSICIAN ORDER COMMUNICATION  CONCERNING: P&T Medication Policy on Herbal Medications  DESCRIPTION:  This patient's order for:  ubiquinol  has been noted.  This product(s) is classified as an "herbal" or natural product. Due to a lack of definitive safety studies or FDA approval, nonstandard manufacturing practices, plus the potential risk of unknown drug-drug interactions while on inpatient medications, the Pharmacy and Therapeutics Committee does not permit the use of "herbal" or natural products of this type within [place].   ACTION TAKEN: The pharmacy department is unable to verify this order at this time. Please reevaluate patient's clinical condition at discharge and address if the herbal or natural product(s) should be resumed at that time.   

## 2016-03-11 NOTE — [facility] Group Notes (Signed)
[facility] LCSW Group Therapy   03/11/2016 2pm  Type of Therapy: Group Therapy   Participation Level: Active   Participation Quality: Attentive, Sharing and Supportive   Affect: Appropriate  Cognitive: Alert and Oriented   Insight: Developing/Improving and Engaged   Engagement in Therapy: Developing/Improving and Engaged   Modes of Intervention: Clarification, Confrontation, Discussion, Education, Exploration,  Limit-setting, Orientation, Problem-solving, Rapport Building, Dance movement psychotherapist, Socialization and Support  Summary of Progress/Problems: The topic for group therapy was feelings about diagnosis. Pt actively participated in group discussion on their past and current diagnosis and how they feel towards this. Pt also identified how society and family members judge them, based on their diagnosis as well as stereotypes and stigmas. Pt reported that the pt's diagnosis was anxiety and that the pt agreed with this diagnosis.  Pt acknowledged that this was not a description of the pt, that this diagnosis was a description of the pt's symptoms.  Pt reported that the pt viewed the pt's diagnosis of anxiety as a positive factor in that the pt can now threat the pt's diagnosis effectively.  Pt reported feeling anxious, but positive.  Pt was polite and cooperative with the CSW and other group members and focused and attentive to the topics discussed and the sharing of others.     Dorothe Pea. Dimitry Holsworth, LCSWA, LCAS

## 2016-03-11 NOTE — Progress Notes (Signed)
Patient ID: Daniel Warren, male   DOB: 1961/02/13, 55 y.o.   MRN: [id number] Patient admitted to unit after originally coming to the ED for weakness on left side and possible CVA. CVA ruled out. Patient stated, "I talked to the doctor and he feels like I might have anxiety." Patient did admit to grabbing a gun from his closet and holding it but stated it was not loaded. He stated, "It was a stupid thing to do." Patient currently denies SI/HI/AVH. Cooperative and pleasant during admission. Noted some financial and work stressors. Patient oriented to the unit. Vitals obtained. Skin search done with Veronique RN. No contraband found. Safety maintained with 15 min checks.
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
## 2016-03-12 LAB — URINE CULTURE: CULTURE: NO GROWTH

## 2016-03-12 MED ORDER — TRAZODONE HCL 50 MG PO TABS: 50.0000 mg | ORAL_TABLET | Freq: Every evening | ORAL | Status: DC | PRN

## 2016-03-12 NOTE — [facility] Group Notes (Signed)
[facility] Group Notes:  (Nursing/MHT/Case Management/Adjunct)  Date:  03/12/2016  Time:  4:36 AM  Type of Therapy:  Group Therapy  Participation Level:  Active  Participation Quality:  Appropriate  Affect:  Appropriate  Cognitive:  Appropriate  Insight:  Appropriate  Engagement in Group:  Engaged  Modes of Intervention:  n/a  Summary of Progress/Problems:  Veva Holes 03/12/2016, 4:36 AM

## 2016-03-12 NOTE — [facility] Group Notes (Addendum)
[facility] LCSW Group Therapy   03/12/2016  1pm   Type of Therapy: Group Therapy   Participation Level: Active   Participation Quality: Attentive, Sharing and Supportive   Affect: Depressed and Flat   Cognitive: Alert and Oriented   Insight: Developing/Improving and Engaged   Engagement in Therapy: Developing/Improving and Engaged   Modes of Intervention: Clarification, Confrontation, Discussion, Education, Exploration, Limit-setting, Orientation, Problem-solving, Rapport Building, Dance movement psychotherapist, Socialization and Support   Summary of Progress/Problems: The topic for group today was emotional regulation. This group focused on both positive and negative emotion identification and allowed  group members to process ways to identify feelings, regulate negative emotions, and find healthy ways to manage internal/external emotions. Group members were asked to reflect on a time when their reaction to an emotion led to a negative outcome and explored how alternative responses using emotion regulation would have benefited them. Group members were also asked to discuss a time when emotion regulation was utilized when a negative emotion was experienced. Pt identified the pt once felt depressed when the pt was hospitalized and became even more depressed because the pt could not regulate the pt's emotions by willpower, which usually helped the pt cope.  Pt reports the pt usually uses helping others to cope with the pt's emotions, as well.  Pt was polite and cooperative with the CSW and other group members and focused and attentive to the topics discussed and the sharing of others.   CSW actively validated the pt's opinion and provided feedback.     Dorothe Pea. Mandy Fitzwater, MSW, LCSWA, LCAS

## 2016-03-12 NOTE — [facility] Group Notes (Signed)
[facility] Group Notes:  (Nursing/MHT/Case Management/Adjunct)  Date:  03/12/2016  Time:  4:23 PM  Type of Therapy:  Psychoeducational Skills  Participation Level:  Active  Participation Quality:  Appropriate  Affect:  Appropriate  Cognitive:  Appropriate  Insight:  Appropriate  Engagement in Group:  Engaged  Modes of Intervention:  Support  Summary of Progress/Problems:  Daniel Warren 03/12/2016, 4:23 PM
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
## 2016-03-12 NOTE — Progress Notes (Signed)
Recreation Therapy Notes  Date: 08.02.17 Time: 9:30 am Location: Craft Room  Group Topic: Self-esteem  Goal Area(s) Addresses:  Patient will write at least one positive trait about self. Patient will verbalize benefit of having healthy self-esteem.  Behavioral Response: Attentive, Interactive  Intervention: I Am  Activity: Patients were given a worksheet with the letter I on it and instructed to write as many positive traits about themselves inside the letter.   Education: LRT educated patients on ways they can increase their self-esteem.  Education Outcome: Acknowledges education/In group clarification offered   Clinical Observations/Feedback: Patient wrote positive traits about self. Patient contributed to group discussion by stating how his self-esteem affects him.  Jacquelynn Cree, LRT/CTRS 03/12/2016 1:10 PM

## 2016-03-12 NOTE — Plan of Care (Signed)
Problem: Safety: Goal: Periods of time without injury will increase Outcome: Progressing Patient able to contract for safety, room is closer to the nurses' station for quick random and 15 minutes observation checks; denied self harm/injury/SI. Will monitor.

## 2016-03-12 NOTE — Progress Notes (Signed)
D: Patient stated slept good last night .Stated appetite is good and energy level  Is normal. Stated concentration is good . Stated on Depression scale , hopeless and anxiety .( low 0-10 high) Denies suicidal  homicidal ideations  .  No auditory hallucinations  No pain concerns . Appropriate ADL'S. Interacting with peers and staff.  A: Encourage patient participation with unit programming . Instruction  Given on  Medication , verbalize understanding. R: Voice no other concerns. Staff continue to monitor  

## 2016-03-12 NOTE — Progress Notes (Signed)
[facility] MD Progress Note  03/12/2016 10:37 AM Daniel Warren  MRN:  [id number] Subjective:   Daniel Enrico Dabbsis a 55 y.o.male who presented to our ER on 7/31with left-sided deficits. According to the wife the patient got angry/upset on 7/30 and developed left-sided weakness. 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 As the symptoms didn't improved and persisted overnight she called  EMS.  Head CT and brain MRI  were wnl.  In addition he was IVC by family for pointing a gun at himself on 7/30.  Patient was vague and evasive when confronted about this. He reported that he had several deaths in the family and was having some financial stressors.  Patient did complain of anxiety and depressed mood.  Today the pt reports feeling better.  He was tearful during the interview and was concern about being considered as weak.  Pt says he has been attending groups and feels they have been very helpful.  Denies problems with mood, appetite, energy or concentration.  Denies SI, HI or hallucinations.  Denies having side effects or physical complaints.  Principal Problem: Severe major depression, single episode, without psychotic features (HCC) Diagnosis:   Patient Active Problem List   Diagnosis Date Noted  . CVD (cerebrovascular disease) [I67.9] 03/11/2016  . Dyslipidemia [E78.5] 03/11/2016  . Severe major depression, single episode, without psychotic features (HCC) [F32.2] 03/10/2016  . Panic attacks [F41.0] 03/10/2016  . Hypertension [I10] 03/10/2016   Total Time spent with patient: 30 minutes  Past Psychiatric History:   Past Medical History:  Past Medical History:  Diagnosis Date  . Hypertension   . Stroke [facility])    History reviewed. No pertinent surgical history.  Family History: History reviewed. No pertinent family history.  Family Psychiatric  History:   Social History:   History  Alcohol Use No     History  Drug Use No    Social History   Social History  . Marital status: Married    Spouse name: N/A  . Number of children: N/A  . Years of education: N/A   Social History Main Topics  . Smoking status: Never Smoker  . Smokeless tobacco: Current User    Types: Chew  . Alcohol use No  . Drug use: No  . Sexual activity: Not Asked   Other Topics Concern  . None   Social History Narrative  . None    Sleep: Good  Appetite:  Good  Current Medications: Current Facility-Administered Medications  Medication Dose Route Frequency Provider Last Rate Last Dose  . acetaminophen (TYLENOL) tablet 650 mg  650 mg Oral Q6H PRN Audery Amel, MD      . alum & mag hydroxide-simeth (MAALOX/MYLANTA) 200-200-20 MG/5ML suspension 30 mL  30 mL Oral Q4H PRN Audery Amel, MD      . aspirin tablet 325 mg  325 mg Oral Daily Audery Amel, MD   325 mg at 03/12/16 0858  . atorvastatin (LIPITOR) tablet 20 mg  20 mg Oral Daily Audery Amel, MD   20 mg at 03/12/16 0857  . FLUoxetine (PROZAC) capsule 10 mg  10 mg Oral Daily Jimmy Footman, MD   10 mg at 03/12/16 0858  . lisinopril (PRINIVIL,ZESTRIL) tablet 10 mg  10 mg Oral Daily Jimmy Footman, MD   10 mg at 03/12/16 0857   And  . hydrochlorothiazide (MICROZIDE)  capsule 12.5 mg  12.5 mg Oral Daily Jimmy Footman, MD   12.5 mg at 03/12/16 0857  . magnesium hydroxide (MILK OF MAGNESIA) suspension 30 mL  30 mL Oral Daily PRN Audery Amel, MD      . traZODone (DESYREL) tablet 50 mg  50 mg Oral QHS Jimmy Footman, MD   50 mg at 03/11/16 2135    Lab Results:  Results for orders placed or performed during the hospital encounter of 03/11/16 (from the past 48 hour(s))  Urine Drug Screen, Qualitative ([facility] only)     Status: None   Collection Time: 03/11/16 10:17 AM  Result Value Ref Range   Tricyclic, Ur Screen NONE DETECTED NONE DETECTED   Amphetamines, Ur Screen NONE  DETECTED NONE DETECTED   MDMA (Ecstasy)Ur Screen NONE DETECTED NONE DETECTED   Cocaine Metabolite,Ur [place] NONE DETECTED NONE DETECTED   Opiate, Ur Screen NONE DETECTED NONE DETECTED   Phencyclidine (PCP) Ur S NONE DETECTED NONE DETECTED   Cannabinoid 50 Ng, Ur [place] NONE DETECTED NONE DETECTED   Barbiturates, Ur Screen NONE DETECTED NONE DETECTED   Benzodiazepine, Ur Scrn NONE DETECTED NONE DETECTED   Methadone Scn, Ur NONE DETECTED NONE DETECTED    Comment: (NOTE) 100  Tricyclics, urine               Cutoff 1000 ng/mL 200  Amphetamines, urine             Cutoff 1000 ng/mL 300  MDMA (Ecstasy), urine           Cutoff 500 ng/mL 400  Cocaine Metabolite, urine       Cutoff 300 ng/mL 500  Opiate, urine                   Cutoff 300 ng/mL 600  Phencyclidine (PCP), urine      Cutoff 25 ng/mL 700  Cannabinoid, urine              Cutoff 50 ng/mL 800  Barbiturates, urine             Cutoff 200 ng/mL 900  Benzodiazepine, urine           Cutoff 200 ng/mL 1000 Methadone, urine                Cutoff 300 ng/mL 1100 1200 The urine drug screen provides only a preliminary, unconfirmed 1300 analytical test result and should not be used for non-medical 1400 purposes. Clinical consideration and professional judgment should 1500 be applied to any positive drug screen result due to possible 1600 interfering substances. A more specific alternate chemical method 1700 must be used in order to obtain a confirmed analytical result.  1800 Gas chromato graphy / mass spectrometry (GC/MS) is the preferred 1900 confirmatory method.     Blood Alcohol level:  No results found for: [facility]  Metabolic Disorder Labs: No results found for: HGBA1C, MPG No results found for: PROLACTIN No results found for: CHOL, TRIG, HDL, CHOLHDL, VLDL, LDLCALC  Physical Findings: AIMS:  , ,  ,  , Dental Status Current problems with teeth and/or dentures?: No Does patient usually wear dentures?: No  CIWA:    COWS:      Musculoskeletal: Strength & Muscle Tone: within normal limits Gait & Station: normal Patient leans: N/A  Psychiatric Specialty Exam: Physical Exam  Constitutional: He is oriented to person, place, and time. He appears well-developed and well-nourished.  HENT:  Head: Normocephalic and atraumatic.  Eyes: Conjunctivae and EOM are  normal.  Respiratory: Effort normal.  Neurological: He is alert and oriented to person, place, and time.    Review of Systems  Constitutional: Negative.   HENT: Negative.   Eyes: Negative.   Respiratory: Negative.   Cardiovascular: Negative.   Gastrointestinal: Negative.   Genitourinary: Negative.   Musculoskeletal: Negative.   Skin: Negative.     Blood pressure 112/72, pulse 64, temperature 98.6 F (37 C), temperature source Oral, resp. rate 16, height 5\' 9"  (1.753 m), weight 83.9 kg (185 lb), SpO2 99 %.Body mass index is 27.32 kg/m.  General Appearance: Well Groomed  Eye Contact:  Good  Speech:  Clear and Coherent  Volume:  Normal  Mood:  Dysphoric  Affect:  Appropriate  Thought Process:  Linear and Descriptions of Associations: Intact  Orientation:  Full (Time, Place, and Person)  Thought Content:  Hallucinations: None  Suicidal Thoughts:  No  Homicidal Thoughts:  No  Memory:  Immediate;   Good Recent;   Good Remote;   Good  Judgement:  Fair  Insight:  Fair  Psychomotor Activity:  Normal  Concentration:  Concentration: Good and Attention Span: Good  Recall:  Good  Fund of Knowledge:  Good  Language:  Good  Akathisia:  No  Handed:    AIMS (if indicated):     Assets:  Communication Skills Housing Social Support  ADL's:  Intact  Cognition:  WNL  Sleep:  Number of Hours: 7.3     Treatment Plan Summary:  MDD: pt has been started on fluoxetine 10 mg po q day to target depression, irritability and anxiety  Insomnia: will order trazodone 50 mg po qhs as a prn  Hypertension continue hydrochlorothiazide 12.5 mg a day and  lisinopril 10 mg daily  Dyslipidemia continue Lipitor 20 mg a day  Cerebrovascular disease continue aspirin 325 mg a day  UTI: treated with fosfomycin  On 7/31  Diet low sodium  Vital signs daily  Precautions every 15 minute checks  Hospitalization status involuntary commitment  Labs: TSH and vitamin B12---wnl.  Also order a urine culture  Disposition he will return home with his family  Follow-up he will need to be a scheduled to follow-up with a psychiatrist and with a therapist---[facility]   Jimmy Footman, MD 03/12/2016, 10:37 AM

## 2016-03-12 NOTE — Plan of Care (Signed)
Problem: Education: Goal: Knowledge of [facility] Education information/materials will improve Outcome: Progressing Attending unit programing , verbalization of feelings and working on coping skills

## 2016-03-13 MED ORDER — TRAZODONE HCL 50 MG PO TABS: 50.0000 mg | ORAL_TABLET | Freq: Every evening | ORAL | 0 refills | Status: DC | PRN

## 2016-03-13 MED ORDER — FLUOXETINE HCL 10 MG PO CAPS: 10.0000 mg | ORAL_CAPSULE | Freq: Every day | ORAL | 0 refills | Status: AC

## 2016-03-13 NOTE — Care Management (Signed)
Upon request and subsequent page and order I was able to visit with Daniel Warren again. Daniel Warren and I have interacted and had conversations about his depression and the challenging things taking place in his life at this time. He has experienced several different emotions and had been willing to do the work while participating in the group therapy sessions. Daniel Warren, while not feeling depressed anymore, exhibits a deep sense of gratitude for life, from his relationship with God and while considering the things still available in his life. While participating in a much larger group setting, he has offered transparent insight into his struggles and a reasonable voice to those who also are dealing with mental health diagnosis and drug addiction. His willingness to vulnerable points to some of the healing that has taken place since he has been in our care. Today's conversation carries those same sentiments and observations. The Chaplain was told that Daniel Warren will possibly be discharged tomorrow. As a member of the Care Team I believe he is headed in the right direction emotionally and spiritually.

## 2016-03-13 NOTE — Progress Notes (Signed)
D: Pt denies SI/HI/AVH. Pt is pleasant and cooperative, affect is flat but brightens upon approach. Earlier during the shift patient's family called Clinical research associate expressing their concerns that the MD didn't call them so they are not aware of his treatment plan. In addition they feels that he needs to be here for a while till he gets better. Last they worry about their safety because they believe patient is manipulative and would do anything to get out of the unit. .   Patient appears less anxious and he is interacting with peers and staff appropriately.  A: Pt was offered support and encouragement. Pt was given scheduled medications. Pt was encouraged to attend groups. Q 15 minute checks were done for safety.  R:Pt attends groups and interacts well with peers and staff. Pt is taking medication. Pt has no complaints.Pt receptive to treatment and safety maintained on unit.

## 2016-03-13 NOTE — [facility] Group Notes (Signed)
[facility] Group Notes:  (Nursing/MHT/Case Management/Adjunct)  Date:  03/13/2016  Time:  3:50 PM  Type of Therapy:  Psychoeducational Skills  Participation Level:  Active  Participation Quality:  Appropriate, Attentive and Supportive  Affect:  Appropriate  Cognitive:  Appropriate  Insight:  Appropriate  Engagement in Group:  Engaged and Supportive  Modes of Intervention:  Discussion and Education  Summary of Progress/Problems:  Daniel Warren 03/13/2016, 3:50 PM
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
## 2016-03-13 NOTE — [facility] Group Notes (Signed)
Goals Group Date/Time: 03/13/2016 9:00 AM Type of Therapy and Topic: Group Therapy: Goals Group: SMART Goals   Participation Level: Moderate  Description of Group:    The purpose of a daily goals group is to assist and guide patients in setting recovery/wellness-related goals. The objective is to set goals as they relate to the crisis in which they were admitted. Patients will be using SMART goal modalities to set measurable goals. Characteristics of realistic goals will be discussed and patients will be assisted in setting and processing how one will reach their goal. Facilitator will also assist patients in applying interventions and coping skills learned in psycho-education groups to the SMART goal and process how one will achieve defined goal.   Therapeutic Goals:   -Patients will develop and document one goal related to or their crisis in which brought them into treatment.  -Patients will be guided by LCSW using SMART goal setting modality in how to set a measurable, attainable, realistic and time sensitive goal.  -Patients will process barriers in reaching goal.  -Patients will process interventions in how to overcome and successful in reaching goal.   Patient's Goal: Pt shared that the pt's goal upon discharge is related to the pt's crisis which led to the pt's hospitalization.  Pt shared that barrier's to the pt's goal or goals is a lack of self-care, from trying to "do too much for others".  Pt shared that the pt's goal is to "take better care of myself while taking care of others".  Pt shared that the pt will insure success in practicing self-care by "stopping, regrouping and talking to others".     Therapeutic Modalities:  Motivational Interviewing  Research officer, political party  SMART goals setting   Dorothe Pea. Janalynn Eder, LCSWA, LCAS

## 2016-03-13 NOTE — Progress Notes (Signed)
D;Affect cheerful on approach  Interacting with peers and staff . Attending unit programing Patient stated slept good last night .Stated appetite is good and energy level  Is normal. Stated concentration is good . Stated on Depression scale  0 , hopeless 0 and anxiety 0 .( low 0-10 high) Denies suicidal  homicidal ideations  .  No auditory hallucinations  No pain concerns . Appropriate ADL'S. Interacting with peers and staff.  Patient esxpressing happiness to go home  Tomorrow  A: Encourage patient participation with unit programming . Instruction  Given on  Medication , verbalize understanding. R: Voice no other concerns. Staff continue to monitor

## 2016-03-13 NOTE — Progress Notes (Signed)
[facility] MD Progress Note  03/13/2016 4:42 PM MENNO VANBERGEN  MRN:  [id number] Subjective:   Daniel Flinn Dabbsis a 55 y.o.male who presented to our ER on 7/31with left-sided deficits. According to the wife the patient got angry/upset on 7/30 and developed left-sided weakness. 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 As the symptoms didn't improved and persisted overnight she called  EMS.  Head CT and brain MRI  were wnl.  In addition he was IVC by family for pointing a gun at himself on 7/30.  Patient was vague and evasive when confronted about this. He reported that he had several deaths in the family and was having some financial stressors.  Patient did complain of anxiety and depressed mood.  Patient has been compliant with treatment recommendations. He has complied with medications and feel they have been helpful. The patient has been attending all groups and has been meeting one to one with the chaplain. He has been calm, pleasant and cooperative with staff. He has displayed appropriate interactions with peers and with staff.  He is tolerating medications well and denies any side effects.  He has been denying suicidality, homicidality and auditory visual hallucinations consistently.  Today he denies having problems with appetite, energy, sleep or concentration. He feels that his mood is improving he feels more hopeful and appears to be future oriented. He tells me he plans to follow up with outpatient treatment. He tells me he understands that he was putting too much on him and he was getting overwhelmed and frustrated. He plans to start prioritizing things and plans to start taking things "one step at the time".  Family has contacted the nurses and Education officer, museum. They are staying and they feel very concerned about the patient's safety. He feels that he is still in as what we want to  hear in order to be discharged. They are afraid about the patient's safety and her own safety if the patient were to be discharged. We'll schedule a family meeting today  The patient's wife and their 2 adult children came to the meeting along with a very close family friend this Probation officer and the Education officer, museum were present and met with the family initially.  We met with the family for about 45 minutes. They had a lot of questions about patient's safety. We displayed to them that the patient here has not displayed any unsafe or disruptive behaviors. He has never made any threats against himself or anybody else. He has not displayed any kind of aggression, irritability, agitation or impulsivity. We discussed in detail with the treatment plan and discharge planning. Patient's family at the end of the meeting was in agreement with discharge plans.  Patient then joined the meeting for about 15-20 minutes.  Meeting between him and the family went well. Patient was very positive and hopeful and family was very supportive.  Per nursing:  Pt denies SI/HI/AVH. Pt is pleasant and cooperative, affect is flat but brightens upon approach. Earlier during the shift patient's family called Probation officer expressing their concerns that the MD didn't call them so they are not aware of his treatment plan. In addition they feels that he needs to be here for a while till he gets better. Last they worry about their safety because they believe patient is manipulative and would do anything to get out of the unit. Marland Kitchen  Patient appears less anxious and he is interacting with peers and staff appropriately.  A: Pt was offered support and encouragement. Pt was given scheduled medications. Pt was encouraged to attend groups. Q 15 minute checks were done for safety.  R:Pt attends groups and interacts well with peers and staff. Pt is taking medication. Pt has no complaints.Pt receptive to treatment and safety maintained on unit.   Principal Problem:  Severe major depression, single episode, without psychotic features ([place]) Diagnosis:   Patient Active Problem List   Diagnosis Date Noted  . CVD (cerebrovascular disease) [I67.9] 03/11/2016  . Dyslipidemia [E78.5] 03/11/2016  . Severe major depression, single episode, without psychotic features ([place]) [F32.2] 03/10/2016  . Panic attacks [F41.0] 03/10/2016  . Hypertension [I10] 03/10/2016   Total Time spent with patient: 30 minutes  Past Psychiatric History:   Past Medical History:  Past Medical History:  Diagnosis Date  . Hypertension   . Stroke [facility])    History reviewed. No pertinent surgical history.  Family History: History reviewed. No pertinent family history.  Family Psychiatric  History:   Social History:  History  Alcohol Use No     History  Drug Use No    Social History   Social History  . Marital status: Married    Spouse name: N/A  . Number of children: N/A  . Years of education: N/A   Social History Main Topics  . Smoking status: Never Smoker  . Smokeless tobacco: Current User    Types: Chew  . Alcohol use No  . Drug use: No  . Sexual activity: Not Asked   Other Topics Concern  . None   Social History Narrative  . None    Sleep: Good  Appetite:  Good  Current Medications: Current Facility-Administered Medications  Medication Dose Route Frequency Provider Last Rate Last Dose  . acetaminophen (TYLENOL) tablet 650 mg  650 mg Oral Q6H PRN Gonzella Lex, MD      . alum & mag hydroxide-simeth (MAALOX/MYLANTA) 200-200-20 MG/5ML suspension 30 mL  30 mL Oral Q4H PRN Gonzella Lex, MD      . aspirin tablet 325 mg  325 mg Oral Daily Gonzella Lex, MD   325 mg at 03/13/16 0086  . atorvastatin (LIPITOR) tablet 20 mg  20 mg Oral Daily Gonzella Lex, MD   20 mg at 03/13/16 7619  . FLUoxetine (PROZAC) capsule 10 mg  10 mg Oral Daily Hildred Priest, MD   10 mg at 03/13/16 0839  . lisinopril (PRINIVIL,ZESTRIL) tablet 10 mg  10 mg Oral Daily  Hildred Priest, MD   10 mg at 03/13/16 5093   And  . hydrochlorothiazide (MICROZIDE) capsule 12.5 mg  12.5 mg Oral Daily Hildred Priest, MD   12.5 mg at 03/13/16 2671  . magnesium hydroxide (MILK OF MAGNESIA) suspension 30 mL  30 mL Oral Daily PRN Gonzella Lex, MD      . traZODone (DESYREL) tablet 50 mg  50 mg Oral QHS PRN Hildred Priest, MD        Lab Results:  No results found for this or any previous visit (from the past 48 hour(s)).  Blood Alcohol level:  No results found for: [facility]  Metabolic Disorder Labs: No results found for: HGBA1C, MPG No results found for: PROLACTIN No results found for: CHOL, TRIG, HDL, CHOLHDL, VLDL, LDLCALC  Physical Findings: AIMS:  , ,  ,  , Dental Status Current problems with teeth and/or dentures?: No Does patient usually  wear dentures?: No  CIWA:    COWS:     Musculoskeletal: Strength & Muscle Tone: within normal limits Gait & Station: normal Patient leans: N/A  Psychiatric Specialty Exam: Physical Exam  Constitutional: He is oriented to person, place, and time. He appears well-developed and well-nourished.  HENT:  Head: Normocephalic and atraumatic.  Eyes: Conjunctivae and EOM are normal.  Respiratory: Effort normal.  Neurological: He is alert and oriented to person, place, and time.    Review of Systems  Constitutional: Negative.   HENT: Negative.   Eyes: Negative.   Respiratory: Negative.   Cardiovascular: Negative.   Gastrointestinal: Negative.   Genitourinary: Negative.   Musculoskeletal: Negative.   Skin: Negative.     Blood pressure (!) 142/75, pulse 65, temperature 98.5 F (36.9 C), resp. rate 18, height 5' 9"  (1.753 m), weight 83.9 kg (185 lb), SpO2 99 %.Body mass index is 27.32 kg/m.  General Appearance: Well Groomed  Eye Contact:  Good  Speech:  Clear and Coherent  Volume:  Normal  Mood:  Dysphoric  Affect:  Appropriate  Thought Process:  Linear and Descriptions of Associations:  Intact  Orientation:  Full (Time, Place, and Person)  Thought Content:  Hallucinations: None  Suicidal Thoughts:  No  Homicidal Thoughts:  No  Memory:  Immediate;   Good Recent;   Good Remote;   Good  Judgement:  Fair  Insight:  Fair  Psychomotor Activity:  Normal  Concentration:  Concentration: Good and Attention Span: Good  Recall:  Good  Fund of Knowledge:  Good  Language:  Good  Akathisia:  No  Handed:    AIMS (if indicated):     Assets:  Communication Skills Housing Social Support  ADL's:  Intact  Cognition:  WNL  Sleep:  Number of Hours: 7.75     Treatment Plan Summary:  MDD: pt has been started on fluoxetine 10 mg po q day to target depression, irritability and anxiety  Insomnia: pt has been started on trazodone 50 mg po qhs as a prn  Hypertension continue hydrochlorothiazide 12.5 mg a day and lisinopril 10 mg daily  Dyslipidemia continue Lipitor 20 mg a day  Cerebrovascular disease continue aspirin 325 mg a day  UTI: treated with fosfomycin  On 7/31. Urine culture neg  Diet low sodium  Vital signs daily  Precautions every 15 minute checks  Hospitalization status involuntary commitment  Labs: urine culture, TSH and vitamin B12---wnl.    Disposition he will return home with his family. Plan to d/c tomorrow at 8 am  Follow-up he will need to be a scheduled to follow-up with a psychiatrist and with a therapist---[facility]   Hildred Priest, MD 03/13/2016, 4:42 PM

## 2016-03-13 NOTE — [facility] Suicide Risk Assessment (Signed)
[facility] Discharge Suicide Risk Assessment   Principal Problem: Severe major depression, single episode, without psychotic features [facility]) Discharge Diagnoses:  Patient Active Problem List   Diagnosis Date Noted  . CVD (cerebrovascular disease) [I67.9] 03/11/2016  . Dyslipidemia [E78.5] 03/11/2016  . Severe major depression, single episode, without psychotic features ([facility]) [F32.2] 03/10/2016  . Panic attacks [F41.0] 03/10/2016  . Hypertension [I10] 03/10/2016    Psychiatric Specialty Exam: ROS  Blood pressure (!) 142/99, pulse 70, temperature 98.5 F (36.9 C), resp. rate 20, height 5\' 9"  (1.753 m), weight 83.9 kg (185 lb), SpO2 99 %.Body mass index is 27.32 kg/m.                                                       Mental Status Per Nursing Assessment::   On Admission:  Suicidal ideation indicated by patient, Self-harm thoughts  Demographic Factors:  Male and Caucasian  Loss Factors: Financial problems/change in socioeconomic status  Historical Factors: Impulsivity  Risk Reduction Factors:   Sense of responsibility to family, Employed, Living with another person, especially a relative and Positive social support  Continued Clinical Symptoms:  Depression:   Severe Medical Diagnoses and Treatments/Surgeries  Cognitive Features That Contribute To Risk:  None    Suicide Risk:  Minimal: No identifiable suicidal ideation.  Patients presenting with no risk factors but with morbid ruminations; may be classified as minimal risk based on the severity of the depressive symptoms  Follow-up Information    [facility]. Go on 03/14/2016.   Why:  Please arrive to the walk-in clinic between the hours of 8am-2:30pm for an assessment for medication management and therapy. Arrive as early as possible for prompt service. Please call Unk Pinto at [phone] for questions and assistance.  Contact information: [facility] [place] [address] [place] [address] Ph: [phone] Fax: [phone]          Jimmy Footman, MD 03/14/2016, 7:46 AM

## 2016-03-13 NOTE — BHH Group Notes (Signed)
[facility] LCSW Group Therapy   03/13/2016 9:30 am   Type of Therapy: Group Therapy   Participation Level: Active   Participation Quality: Attentive, Sharing and Supportive   Affect: Appropriate   Cognitive: Alert and Oriented   Insight: Developing/Improving and Engaged   Engagement in Therapy: Developing/Improving and Engaged   Modes of Intervention: Clarification, Confrontation, Discussion, Education, Exploration, Limit-setting, Orientation, Problem-solving, Rapport Building, Dance movement psychotherapist, Socialization and Support   Summary of Progress/Problems: The topic for group was balance in life. Today's group focused on defining balance in one's own words, identifying things that can knock one off balance, and exploring healthy ways to maintain balance in life. Group members were asked to provide an example of a time when they felt off balance, describe how they handled that situation, and process healthier ways to regain balance in the future. Group members were asked to share the most important tool for maintaining balance that they learned while at [facility] and how they plan to apply this method after discharge. Pt shared that the pt's defines balance as being able to help others.  Pt shared that the pt felt off balance when the pt was losing his temper.  Pt shared that a primary coping mechanism learned at [facility] is talking about the pt's problems and that the pt will use this tool when feeling "off balance".  Pt was polite and cooperative with the CSW and other group members and focused and attentive to the topics discussed and the sharing of others.

## 2016-03-13 NOTE — Plan of Care (Signed)
Problem: Coping: Goal: Ability to verbalize frustrations and anger appropriately will improve Outcome: Progressing Patient verbalize feelings.

## 2016-03-13 NOTE — BHH Group Notes (Signed)
[facility] Group Notes:  (Nursing/MHT/Case Management/Adjunct)  Date:  03/13/2016  Time:  2:12 AM  Type of Therapy:  Group Therapy  Participation Level:  Active  Participation Quality:  Appropriate  Affect:  Appropriate  Cognitive:  Appropriate  Insight:  Appropriate  Engagement in Group:  Engaged  Modes of Intervention:  Discussion  Summary of Progress/Problems: Pt. Stated that his goal was to maintain a positive attitude. He said that the group with the social worker was especially effective for him because it felt as though he has built a bond with his fellow pts.   Fanny Skates Harveen Flesch 03/13/2016, 2:12 AM

## 2016-03-13 NOTE — Social Work (Signed)
CSW met with patient and family in regards to discharge planning. Pt will follow-up with [facility] on Friday (03/14/16) at 8am. Nurse, Polly Cobia aware of early discharge plans and will notify 3rd shift staff to  have pt ready at that time.    Glorious Peach, MSW, LCSW-A [phone]   03/13/16

## 2016-03-13 NOTE — B[facility] Group Notes (Signed)
[facility] Group Notes:  (Nursing/MHT/Case Management/Adjunct)  Date:  03/13/2016  Time:  4:07 PM  Type of Therapy:  Psychoeducational Skills  Participation Level:  Active  Participation Quality:  Appropriate, Attentive, Sharing and Supportive  Affect:  Appropriate  Cognitive:  Oriented  Insight:  Good  Engagement in Group:  Engaged  Modes of Intervention:  Discussion and Exploration  Summary of Progress/Problems:  Daniel Warren 03/13/2016, 4:07 PM

## 2016-03-13 NOTE — Progress Notes (Signed)
Recreation Therapy Notes  Date: 08.03.17 Time: 1:00 pm Location: Craft Room  Group Topic: Leisure Education  Goal Area(s) Addresses:  Patient will identify activities for each letter of the alphabet. Patient will verbalize ability to use leisure as a Associate Professor.  Behavioral Response: Attentive, Interactive, Left early  Intervention: Leisure Alphabet  Activity: Patients were given a Leisure Information systems manager and instructed to write healthy leisure activities for each letter of the alphabet.  Education: LRT educated patients on what they need to participate in leisure.  Education Outcome: Acknowledges education/In group clarification offered   Clinical Observations/Feedback: Patient completed activity by writing healthy leisure activities. Patient contributed to group discussion by stating some healthy leisure activities. Patient left group at approximately 1:23 pm with the Chaplain and returned to group at approximately 1:25 pm. Patient left group at approximately 1:27 pm with Dr. Ardyth Harps and did not return to group.  Jacquelynn Cree, LRT/CTRS 03/13/2016 1:45 PM

## 2016-03-14 NOTE — Progress Notes (Signed)
  [facility] Adult Case Management Discharge Plan :  Late Entry for CSW Hampton Abbot  Will you be returning to the same living situation after discharge:  Yes,  per record, w family At discharge, do you have transportation home?: Yes,  per record, w family Do you have the ability to pay for your medications: Yes,  per record  Release of information consent forms completed and in the chart;  Patient's signature needed at discharge.  Patient to Follow up at: Follow-up Information    [facility]. Go on 03/14/2016.   Why:  Please arrive to the walk-in clinic between the hours of 8am-2:30pm for an assessment for medication management and therapy. Arrive as early as possible for prompt service. Please call Unk Pinto at [phone] for questions and assistance.  Contact information: [facility] [place] [address] [place] [address] Ph: [phone] Fax: [phone]          Next level of care provider has access to [facility] Link:no  Safety Planning and Suicide Prevention discussed: Yes,  per record, CSW had contact w family  Have you used any form of tobacco in the last 30 days? (Cigarettes, Smokeless Tobacco, Cigars, and/or Pipes): Yes  Has patient been referred to the Quitline?: Patient refused referral  Patient has been referred for addiction treatment: Yes  Sallee Lange 03/14/2016, 4:54 PM

## 2016-03-14 NOTE — Progress Notes (Signed)
D: Pt denies SI/HI/AVH. Pt is pleasant and cooperative. Pt stated he feels better; less angry and able to verbalize  concerns. Patient appears less anxious and he is interacting with peers and staff appropriately.  A: Pt was offered support and encouragement. Pt was given scheduled medications. Pt was encouraged to attend groups. Q 15 minute checks were done for safety.  R:Pt did not attends group. Pt is taking medication. Pt has no complaints.Pt receptive to treatment and safety maintained on unit.

## 2016-03-14 NOTE — Progress Notes (Signed)
Patient discharged home. DC instructions provided and explained. Medications reviewed. Rx given. All questions answered. Pt stable at discharge. Denies SI, HI, AVH.  

## 2016-12-22 IMAGING — CT CT HEAD W/O CM
3 series · 15 of 47 positions shown, 18 images · non-contrast
Comparison: MRI of the brain May 20, 2012

CLINICAL DATA: Altered mental status.  Syncope.

EXAM:
CT HEAD WITHOUT CONTRAST
TECHNIQUE: Contiguous axial images were obtained from the base of the skull
through the vertex without intravenous contrast.

[Series 2: head wo · axial · 0.41mm/px · z∈[-114,+11]mm · 9 of 30 slices shown, 12 images]
[im 3/30  brain]
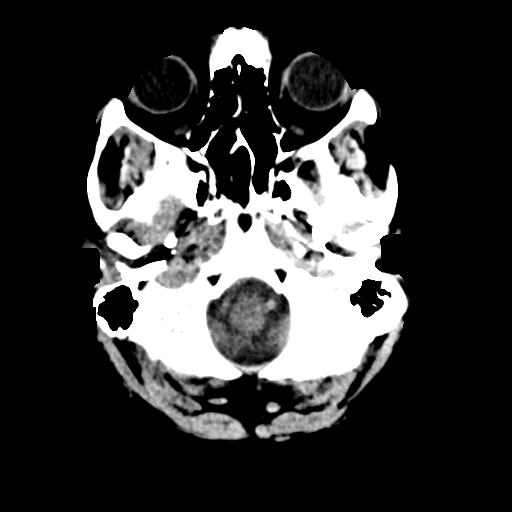
[im 3/30  bone]
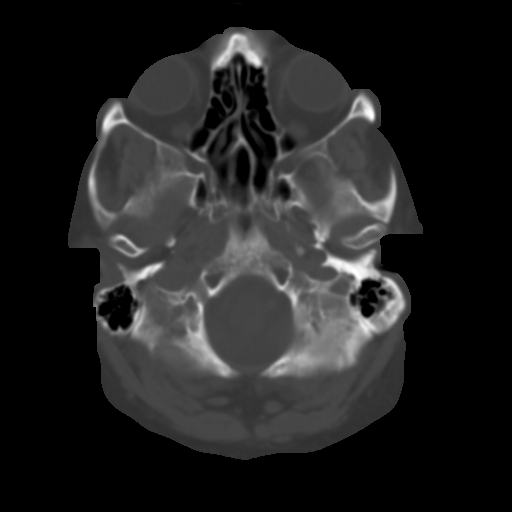
[im 6/30  brain]
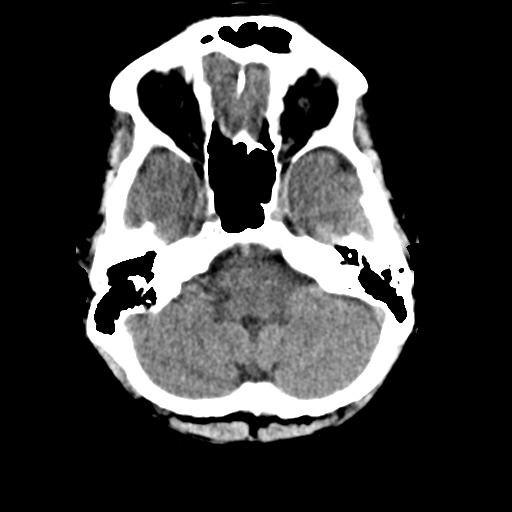
[im 9/30  brain]
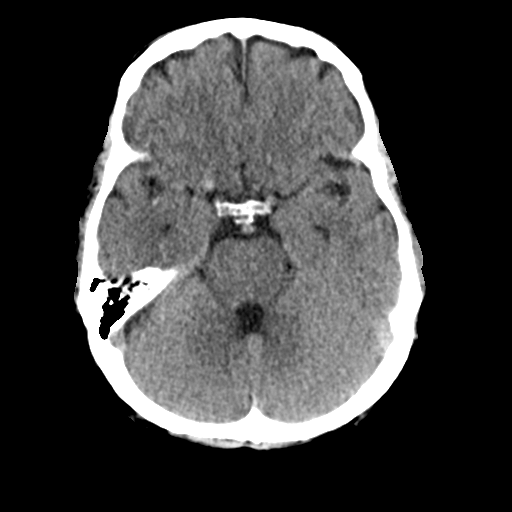
[im 12/30  brain]
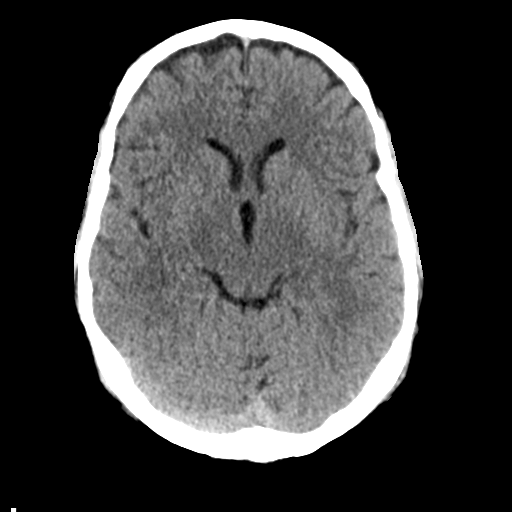
[im 16/30  brain]
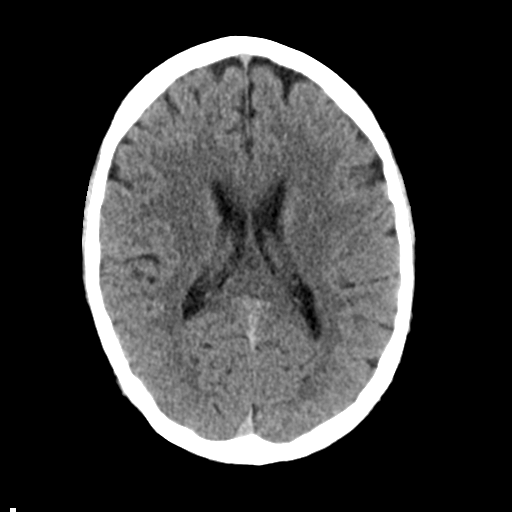
[im 16/30  bone]
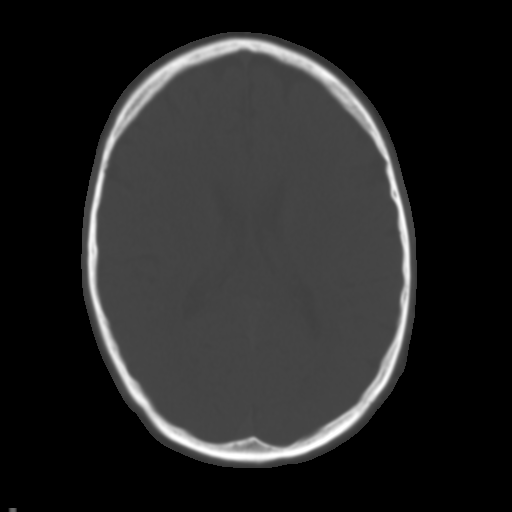
[im 19/30  brain]
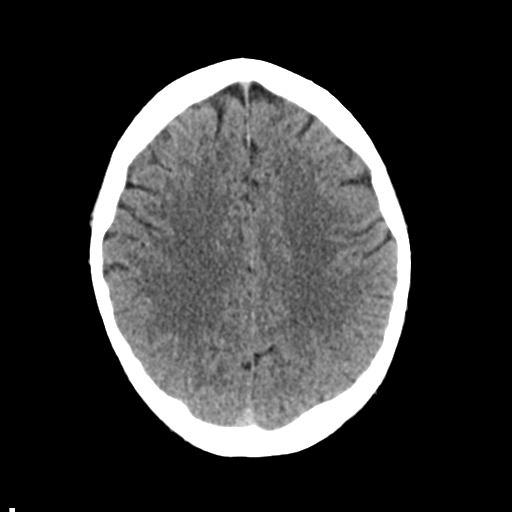
[im 22/30  brain]
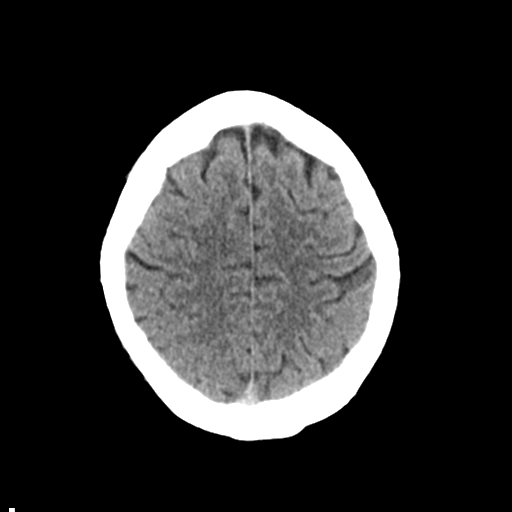
[im 25/30  brain]
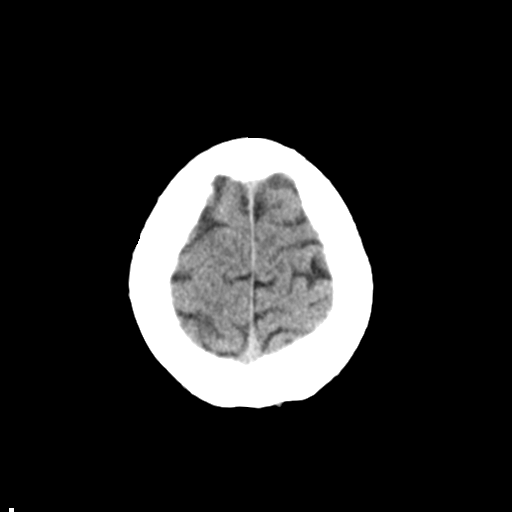
[im 28/30  brain]
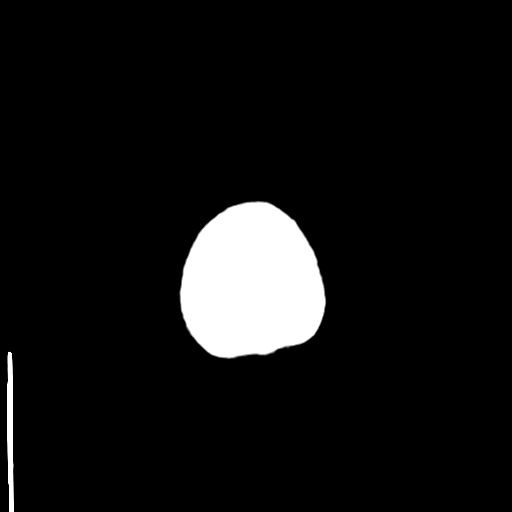
[im 28/30  bone]
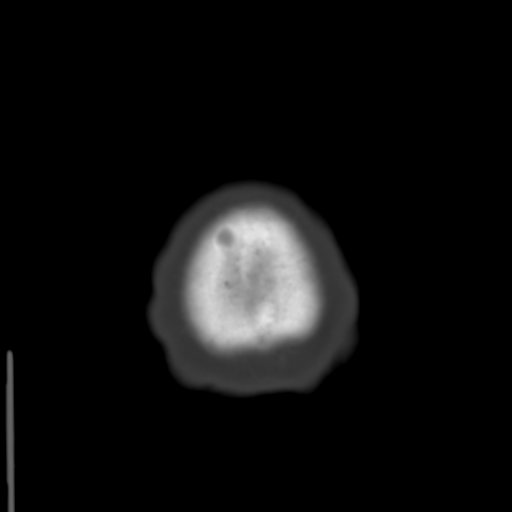

[Series 4: coronal soft tissue · coronal · 0.29mm/px · 3 of 67 slices shown]
[im 23/67  brain]
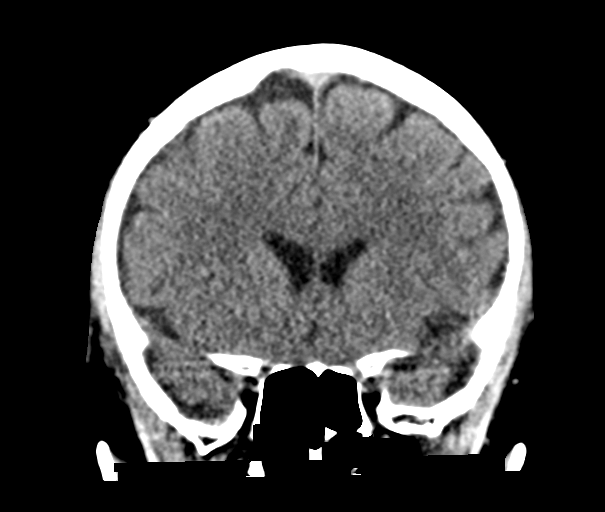
[im 30/67  brain]
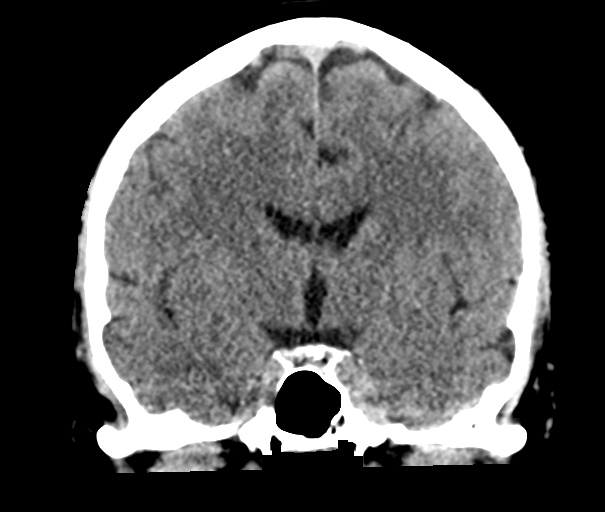
[im 37/67  brain]
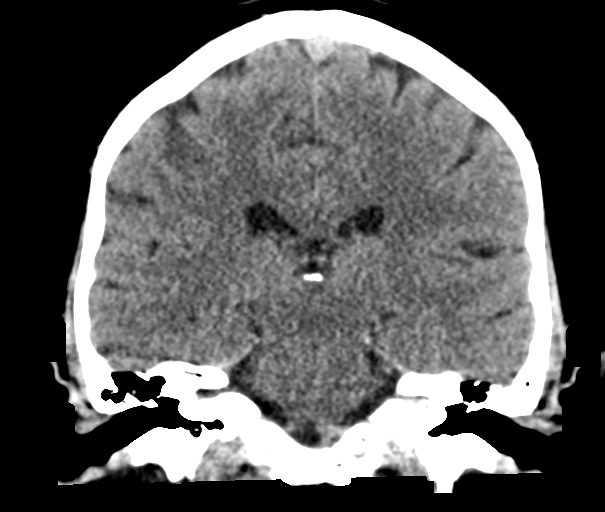

[Series 5: sagittal soft tissue · sagittal · 0.31mm/px · 3 of 51 slices shown]
[im 17/51  brain]
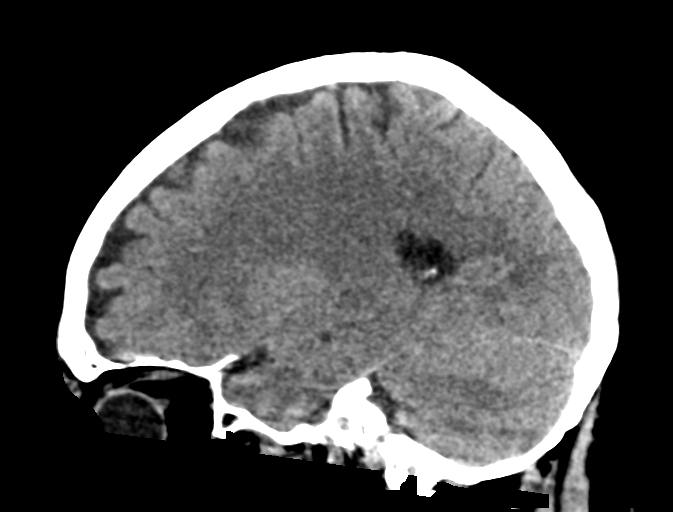
[im 26/51  brain]
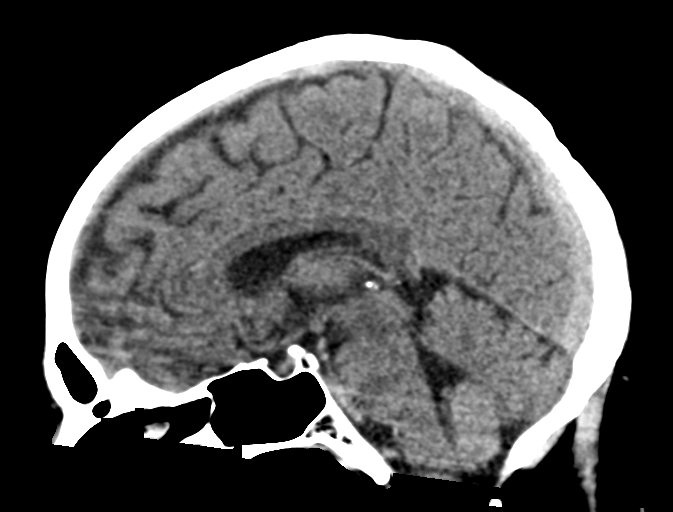
[im 34/51  brain]
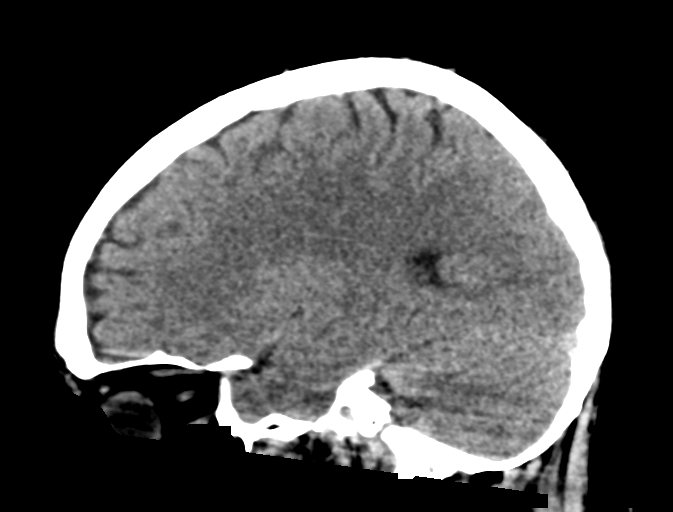

[15 of 47 positions shown; findings below may reference images not displayed]

FINDINGS: Paranasal sinuses, mastoid air cells, bones, and extracranial soft
tissues are normal. No subdural, epidural, or subarachnoid
hemorrhage. Ventricles and sulci are normal for age. Cerebellum,
brainstem, and basal cisterns are normal. No acute cortical ischemia
or infarct. No mass, mass effect, or midline shift.
IMPRESSION: Normal

## 2016-12-22 IMAGING — MR MR HEAD W/O CM
11 series · 43 of 48 positions shown · non-contrast
Comparison: 05/14/2012.  Head CT from earlier today

CLINICAL DATA: Altered mental status with left arm and leg
weakness.

EXAM:
MRI HEAD WITHOUT CONTRAST
TECHNIQUE: Multiplanar, multiecho pulse sequences of the brain and surrounding
structures were obtained without intravenous contrast.

[Series 2: T1 · sagittal · 5.0mm · 0.45mm/px · 3 of 29 slices shown (1 of 2)]
[im 1/29]
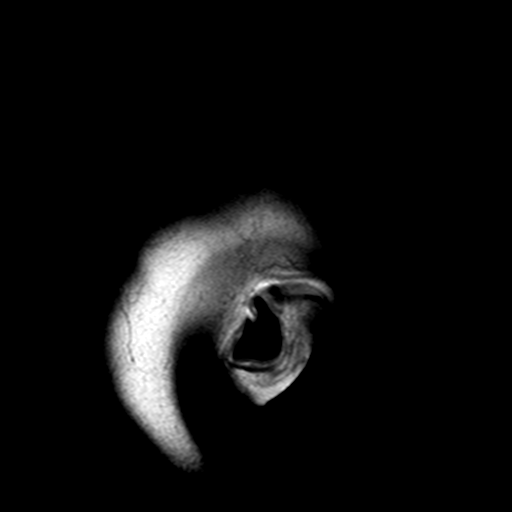
[im 15/29]
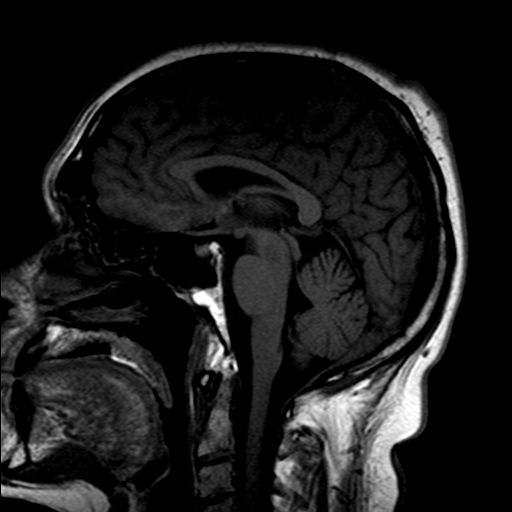
[im 29/29]
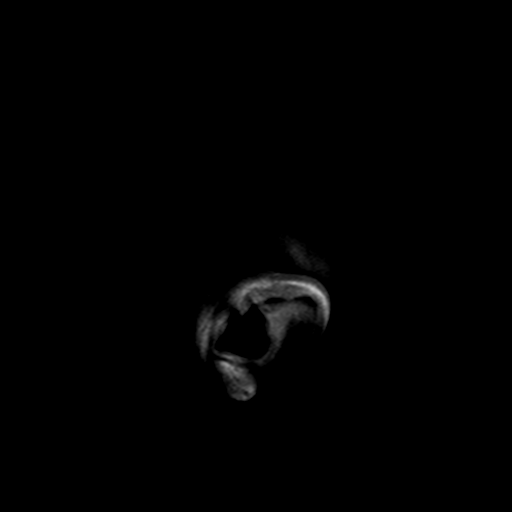

[Series 4: DWI · axial · 4.0mm · 0.94mm/px · z∈[-50,+118]mm · 6 of 45 slices shown (1 of 4)]
[im 1/45]
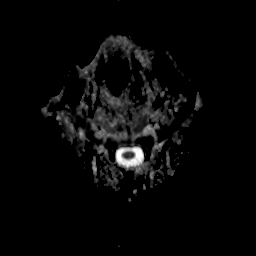
[im 9/45]
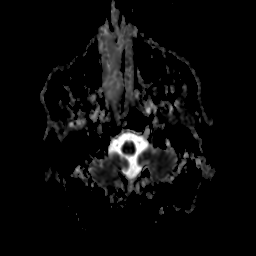
[im 18/45]
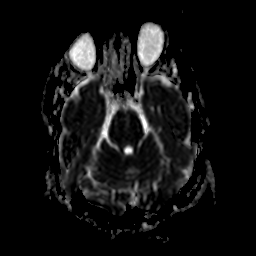
[im 27/45]
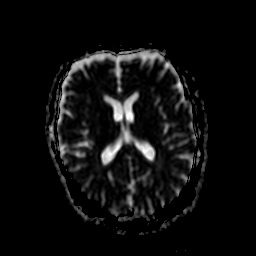
[im 36/45]
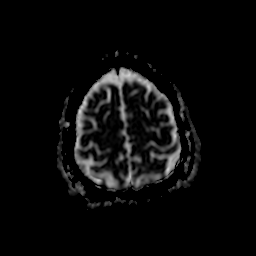
[im 45/45]
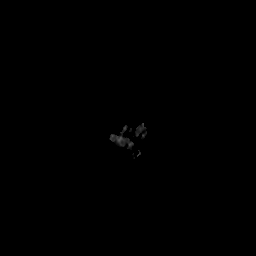

[Series 5: DWI · axial · 4.0mm · 0.94mm/px · z∈[-50,+118]mm · 5 of 43 slices shown (2 of 4)]
[im 1/43]
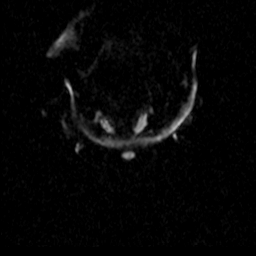
[im 11/43]
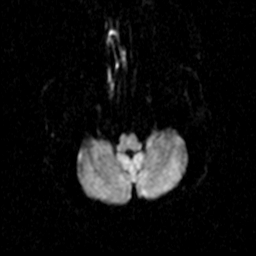
[im 22/43]
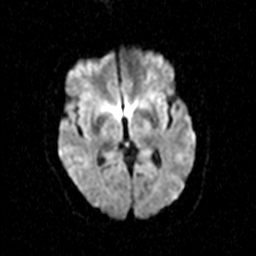
[im 32/43]
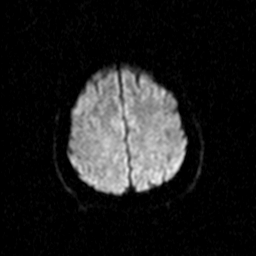
[im 43/43]
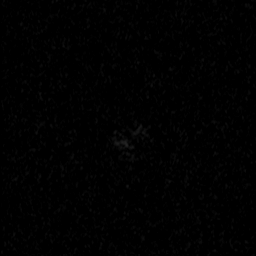

[Series 7: DWI · coronal · 5.0mm · 1.80mm/px · 5 of 39 slices shown (3 of 4)]
[im 1/39]
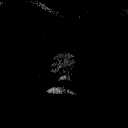
[im 10/39]
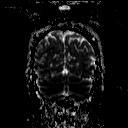
[im 20/39]
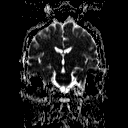
[im 29/39]
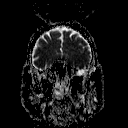
[im 39/39]
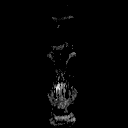

[Series 8: DWI · coronal · 5.0mm · 1.80mm/px · 5 of 37 slices shown (4 of 4)]
[im 1/37]
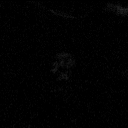
[im 10/37]
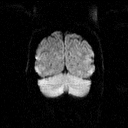
[im 19/37]
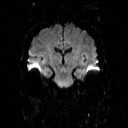
[im 28/37]
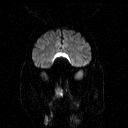
[im 37/37]
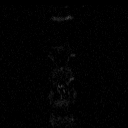

[Series 9: T2 · axial · 5.0mm · 0.45mm/px · z∈[-40,+114]mm · 3 of 26 slices shown (1 of 3)]
[im 1/26]
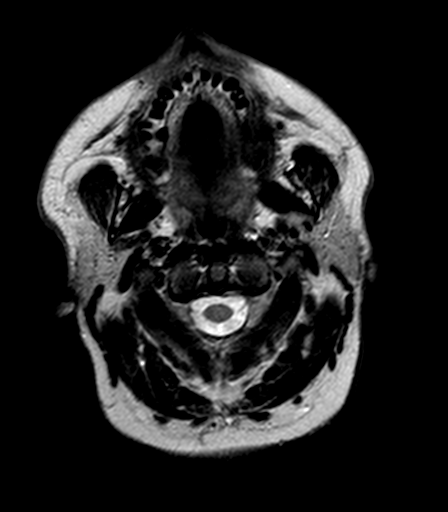
[im 13/26]
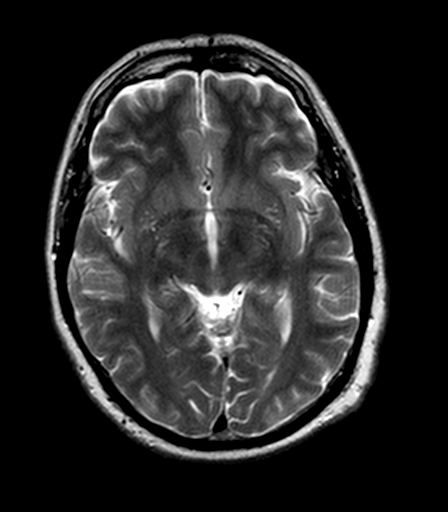
[im 26/26]
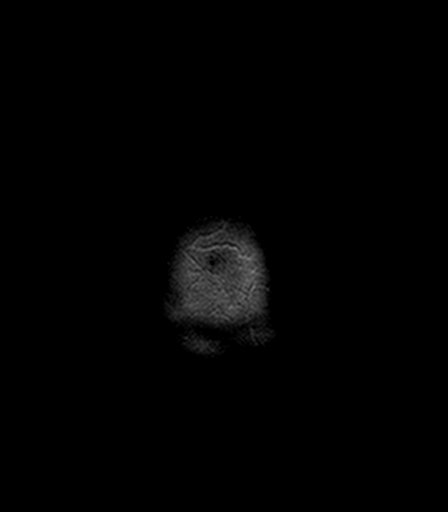

[Series 10: FLAIR · axial · 5.0mm · 0.90mm/px · z∈[-40,+114]mm · 3 of 26 slices shown (1 of 2)]
[im 1/26]
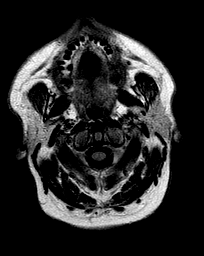
[im 13/26]
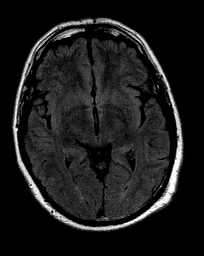
[im 26/26]
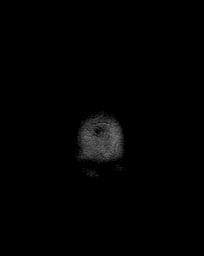

[Series 11: T2 · axial · 5.0mm · 0.45mm/px · z∈[-40,+114]mm · 3 of 26 slices shown (2 of 3)]
[im 1/26]
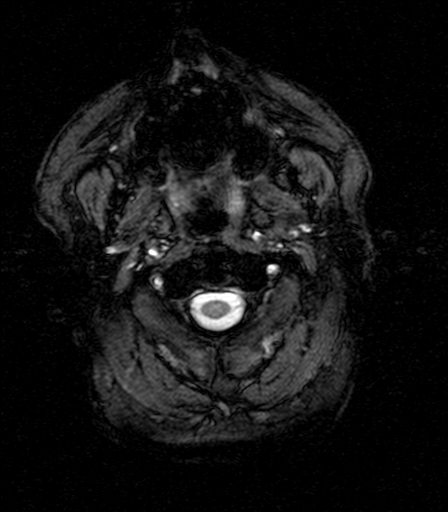
[im 13/26]
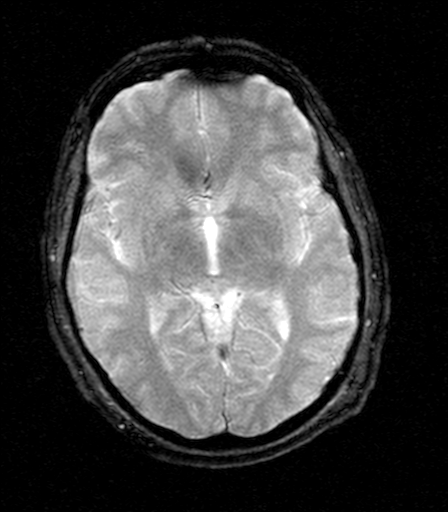
[im 26/26]
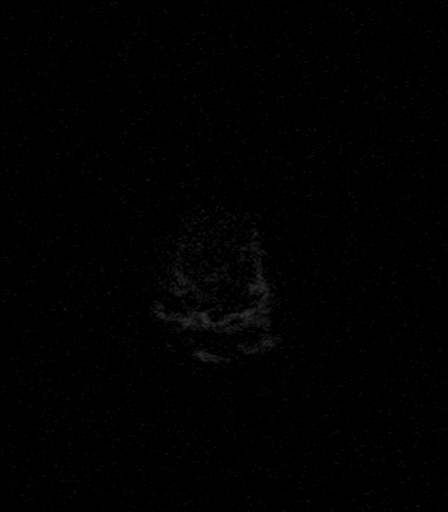

[Series 12: T1 · axial · 3.0mm · 0.45mm/px · z∈[-52,-1]mm · 3 of 64 slices shown (2 of 2)]
[im 1/64]
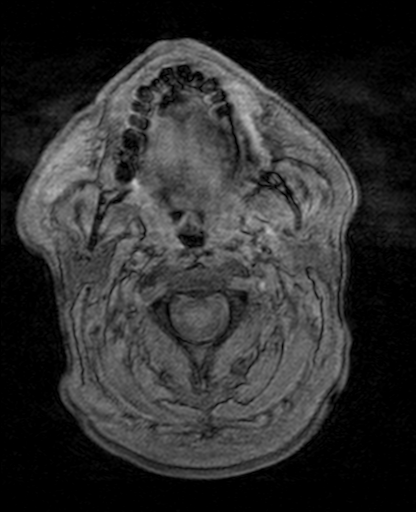
[im 10/64]
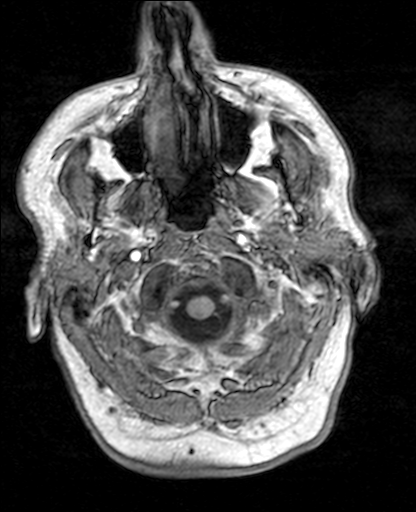
[im 19/64]
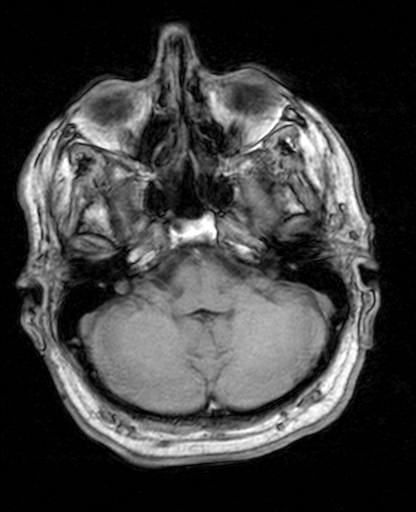

[Series 13: T2 · coronal · 5.0mm · 0.45mm/px · 4 of 29 slices shown (3 of 3)]
[im 1/29]
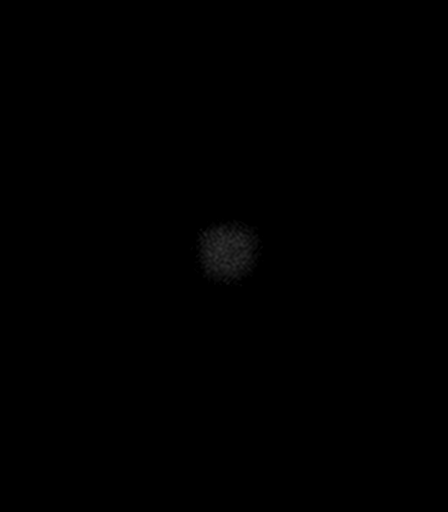
[im 10/29]
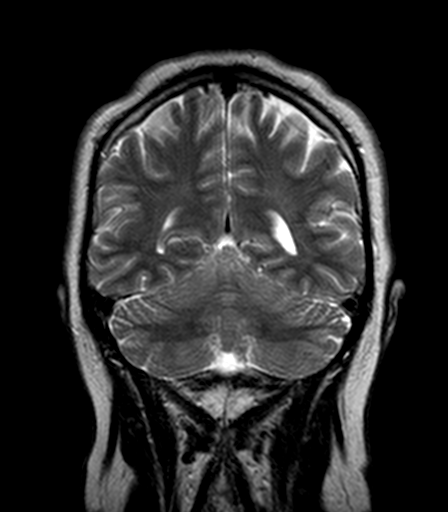
[im 19/29]
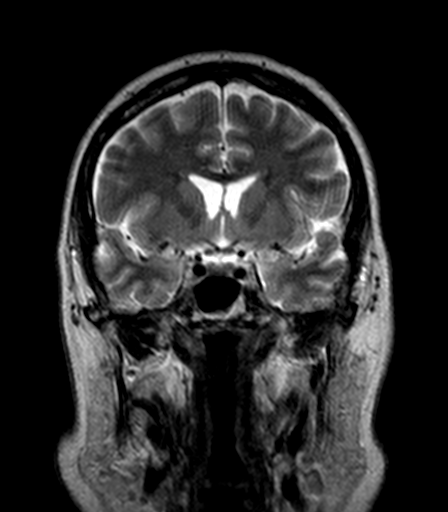
[im 29/29]
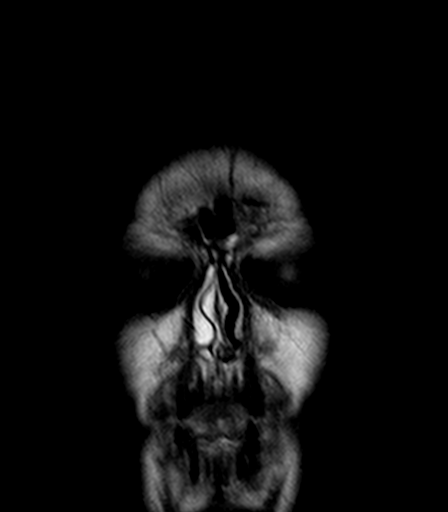

[Series 14: FLAIR · sagittal · 5.0mm · 0.90mm/px · 3 of 24 slices shown (2 of 2)]
[im 1/24]
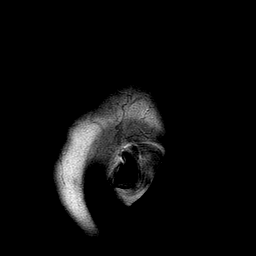
[im 12/24]
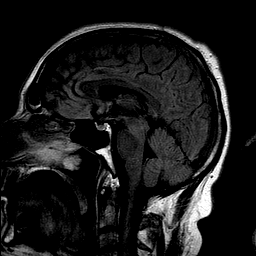
[im 24/24]
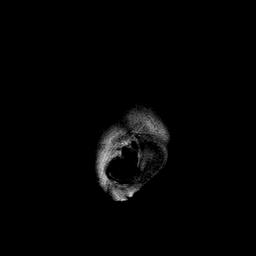

[43 of 48 positions shown; findings below may reference images not displayed]

FINDINGS: Calvarium and upper cervical spine: No focal marrow signal
abnormality.

Orbits: Negative.

Sinuses and Mastoids: Clear.

Brain: Negative. No acute infarct, hemorrhage, hydrocephalus, or
mass lesion. No evidence of large vessel occlusion.
IMPRESSION: Negative.  No explanation for symptoms.

## 2024-03-15 ENCOUNTER — Other Ambulatory Visit: Payer: Self-pay | Admitting: Nurse Practitioner

## 2024-03-15 DIAGNOSIS — M545 Low back pain, unspecified: Secondary | ICD-10-CM

## 2024-03-21 ENCOUNTER — Ambulatory Visit
Admission: RE | Admit: 2024-03-21 | Discharge: 2024-03-21 | Disposition: A | Discharge status: Home / Self Care | Source: Ambulatory Visit | Attending: Nurse Practitioner | Admitting: Nurse Practitioner

## 2024-03-21 DIAGNOSIS — M545 Low back pain, unspecified: Secondary | ICD-10-CM | POA: Diagnosis present

## 2024-04-19 ENCOUNTER — Encounter: Payer: Self-pay | Admitting: Physician Assistant

## 2024-04-19 DIAGNOSIS — N529 Male erectile dysfunction, unspecified: Secondary | ICD-10-CM | POA: Insufficient documentation

## 2024-04-19 DIAGNOSIS — M4807 Spinal stenosis, lumbosacral region: Secondary | ICD-10-CM | POA: Insufficient documentation

## 2024-04-19 DIAGNOSIS — G629 Polyneuropathy, unspecified: Secondary | ICD-10-CM | POA: Insufficient documentation

## 2024-04-19 DIAGNOSIS — E782 Mixed hyperlipidemia: Secondary | ICD-10-CM | POA: Insufficient documentation

## 2024-04-19 DIAGNOSIS — F419 Anxiety disorder, unspecified: Secondary | ICD-10-CM | POA: Insufficient documentation

## 2024-04-19 NOTE — Progress Notes (Unsigned)
 Referring Physician:  Johnson Morna FALCON, NP [address] [place],  [place] [address]  Primary Physician:  Daniel Morna FALCON, NP  History of Present Illness: 04/19/2024 [name] is here today with a chief complaint of ***  Left lower back pain radiating to left leg  Duration: *** Location: *** Quality: *** Severity: ***  Precipitating: aggravated by *** Modifying factors: made better by *** Weakness: none Timing: *** Bowel/Bladder Dysfunction: none  Conservative measures:  Physical therapy: *** has not participated? Multimodal medical therapy including regular antiinflammatories: *** ? Injections: *** no epidural steroid injections  Past Surgery: ***no spine surgery  Daniel Warren has ***no symptoms of cervical myelopathy.  The symptoms are causing a significant impact on the patient's life.   Review of Systems:  A 10 point review of systems is negative, except for the pertinent positives and negatives detailed in the HPI.  Past Medical History: Past Medical History:  Diagnosis Date   Hypertension    Stroke [facility])     Past Surgical History: No past surgical history on file.  Allergies: Allergies as of 04/20/2024 - Review Complete 03/11/2016  Allergen Reaction Noted   Percocet [oxycodone-acetaminophen ] Other (See Comments) 03/10/2016   Codeine Rash and Other (See Comments) 03/10/2016    Medications: Outpatient Encounter Medications as of 04/20/2024  Medication Sig   aspirin  (GOODSENSE ASPIRIN ) 325 MG tablet Take 325 mg by mouth daily.   atorvastatin  (LIPITOR) 20 MG tablet Take 20 mg by mouth daily.   FLUoxetine  (PROZAC ) 10 MG capsule Take 1 capsule (10 mg total) by mouth daily.   lisinopril -hydrochlorothiazide  (PRINZIDE ,ZESTORETIC ) 10-12.5 MG tablet Take 1 tablet by mouth daily.   traZODone  (DESYREL ) 50 MG tablet Take 1 tablet (50 mg total) by mouth at bedtime as needed for sleep.   No facility-administered encounter medications on file as of  04/20/2024.    Social History: Social History   Tobacco Use   Smoking status: Never   Smokeless tobacco: Current    Types: Chew  Substance Use Topics   Alcohol use: No   Drug use: No    Family Medical History: No family history on file.  Physical Examination: @VITALWITHPAIN @  General: Patient is well developed, well nourished, calm, collected, and in no apparent distress. Attention to examination is appropriate.  Psychiatric: Patient is non-anxious.  Head:  Pupils equal, round, and reactive to light.  ENT:  Oral mucosa appears well hydrated.  Neck:   Supple.  ***Full range of motion.  Respiratory: Patient is breathing without any difficulty.  Extremities: No edema.  Vascular: Palpable dorsal pedal pulses.  Skin:   On exposed skin, there are no abnormal skin lesions.  NEUROLOGICAL:     Awake, alert, oriented to person, place, and time.  Speech is clear and fluent. Fund of knowledge is appropriate.   Cranial Nerves: Pupils equal round and reactive to light.  Facial tone is symmetric.  Facial sensation is symmetric.  ROM of spine: ***full.  Palpation of spine: ***non tender.    Strength: Side Biceps Triceps Deltoid Interossei Grip Wrist Ext. Wrist Flex.  R 5 5 5 5 5 5 5   L 5 5 5 5 5 5 5    Side Iliopsoas Quads Hamstring PF DF EHL  R 5 5 5 5 5 5   L 5 5 5 5 5 5    Reflexes are ***2+ and symmetric at the biceps, triceps, brachioradialis, patella and achilles.   Hoffman's is absent.  Clonus is not present.  Toes are down-going.  Bilateral upper and lower extremity sensation is intact to light touch.    Gait is normal.   No difficulty with tandem gait.   No evidence of dysmetria noted.  Medical Decision Making  Imaging: ***  I have personally reviewed the images and agree with the above interpretation.  Assessment and Plan: Mr. Laurich is a pleasant 63 y.o. male with ***    Thank you for involving me in the care of this patient.   I spent a total of ***  minutes in both face-to-face and non-face-to-face activities for this visit on the date of this encounter.   Lyle Decamp, PA-C Dept. of Neurosurgery

## 2024-04-20 ENCOUNTER — Ambulatory Visit
Admission: RE | Admit: 2024-04-20 | Discharge: 2024-04-20 | Disposition: A | Discharge status: Home / Self Care | Source: Ambulatory Visit | Attending: Physician Assistant | Admitting: Physician Assistant

## 2024-04-20 ENCOUNTER — Encounter: Payer: Self-pay | Admitting: Physician Assistant

## 2024-04-20 ENCOUNTER — Ambulatory Visit: Admitting: Physician Assistant

## 2024-04-20 ENCOUNTER — Ambulatory Visit
Admission: RE | Admit: 2024-04-20 | Discharge: 2024-04-20 | Disposition: A | Discharge status: Home / Self Care | Attending: Physician Assistant | Admitting: Physician Assistant

## 2024-04-20 VITALS — BP 176/94 | Ht 69.0 in | Wt 160.0 lb

## 2024-04-20 DIAGNOSIS — M48061 Spinal stenosis, lumbar region without neurogenic claudication: Secondary | ICD-10-CM | POA: Insufficient documentation

## 2024-04-20 DIAGNOSIS — M25552 Pain in left hip: Secondary | ICD-10-CM

## 2024-04-20 MED ORDER — TIZANIDINE HCL 4 MG PO TABS: 2.0000 mg | ORAL_TABLET | Freq: Three times a day (TID) | ORAL | 1 refills | Status: AC | PRN

## 2024-04-28 ENCOUNTER — Ambulatory Visit

## 2024-04-28 DIAGNOSIS — M1612 Unilateral primary osteoarthritis, left hip: Secondary | ICD-10-CM

## 2024-04-28 DIAGNOSIS — M25552 Pain in left hip: Secondary | ICD-10-CM | POA: Diagnosis not present

## 2024-04-28 NOTE — Progress Notes (Signed)
 Orthopaedic Surgery New Patient Visit   History of Present Illness: The patient is a 63 y.o. male seen in clinic for left hip pain.  Patient reports his pain started about 1 year ago without any known injury or trauma.  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  He reports the pain is most notable in the groin and medial thigh.  Associated symptoms include subjective weakness and locking of his hip.  He does report after initial start up pain, walking does improve his symptoms slightly.  He has attempted numerous treatments including Tylenol , ibuprofen, ice and heat as well as modifying his sleep with pillows between his legs.  He has been taking tizanidine  as of late for his low back issues.  His low back is currently being worked up by neurosurgery due to symptoms of numbness into the left foot as well as pain in the lumbar spine.  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     Past Medical, Social and Family History: Past Medical History:  Diagnosis Date   Anxiety    Depression    Erectile dysfunction    Hyperlipidemia    Hypertension    Stroke [facility])    Past Surgical History:  Procedure Laterality Date   NO PAST SURGERIES     Allergies  Allergen Reactions   Percocet [Oxycodone-Acetaminophen ] Other (See Comments)    hallucinations   Codeine Rash and Other (See Comments)    hallucinations   Current Outpatient Medications on File Prior to Visit  Medication Sig Dispense Refill   acetaminophen  (TYLENOL ) 500 MG tablet Take 500 mg by mouth every 6 (six) hours as needed.     aspirin  (GOODSENSE ASPIRIN ) 325 MG tablet Take 325 mg by mouth daily.     atorvastatin  (LIPITOR) 20 MG tablet Take 20 mg by mouth daily.     cetirizine (ZYRTEC ALLERGY) 10 MG tablet Take 10 mg by mouth daily.     FLUoxetine  (PROZAC ) 10 MG capsule Take 1 capsule  (10 mg total) by mouth daily. 30 capsule 0   lisinopril -hydrochlorothiazide  (PRINZIDE ,ZESTORETIC ) 10-12.5 MG tablet Take 1 tablet by mouth daily.     sildenafil (REVATIO) 20 MG tablet 1-3 tablets Orally Once daily as needed; Duration: 30 days     tiZANidine  (ZANAFLEX ) 4 MG tablet Take 0.5-1 tablets (2-4 mg total) by mouth every 8 (eight) hours as needed for muscle spasms. 90 tablet 1   No current facility-administered medications on file prior to visit.   Social History   Tobacco Use   Smoking status: Never   Smokeless tobacco: Current    Types: Chew  Vaping Use   Vaping status: Never Used  Substance Use Topics   Alcohol use: No   Drug use: No      I have reviewed past medical, surgical, social and family history, medications and allergies as documented in the EMR.  Review of Systems - A ROS was performed including pertinent positives and negatives as documented in the HPI.     Physical Exam:  General/Constitutional: NAD Vascular: No edema, swelling or tenderness, except as noted in detailed exam Integumentary: No impressive skin lesions present, except as noted in detailed exam Neuro/Psych: Normal mood and affect, oriented to person, place and time Musculoskeletal: Normal, except as noted in detailed exam and in HPI   Hip Examination (focused):  On focused exam  of the left hip, no obvious deformity noted.  Hip flexion to 100 degrees, abduction and adduction limited secondary to pain.  With the hip flexed to 90 degrees, internal rotation 20 degrees, external rotation 35 degrees.  There is tenderness to palpation about the paraspinal musculature in the lower lumbar spine as well as the gluteal region.  Tenderness to palpation also about the greater trochanter and within the groin region.  While seated, patient has pain with internal rotation.  While supine, patient admits to some discomfort with logroll.  Pain also elicited with Stinchfield testing.  With passive hip flexion  past 90 degrees, there is discomfort within the groin.  Positive FADIR, positive FABER.  There is some weakness with hip abduction strength testing, mainly due to pain.  There is also some mild weakness with hip flexion and adduction.   Vascular/Lymphatic: 2+ dorsalis pedis/posterior tibialis pulses,  foot warm and well perfused Neurologic: Sensation intact to light touch to Tibial/Sural/Saphenous nerves.  Slightly diminished sensation over the superficial and deep peroneal nerves.         XR Hip Imaging: X-rays of the left hip including AP and lateral as well as 1 AP view of the pelvis were obtained on 04/20/2024 at [facility] facility and were available for my review.  Per my interpretation, no fractures or dislocations.  There is concentric, moderate to severe joint space narrowing about the femoral-acetabular joint.  Osteophyte formation noted about the acetabular rim.  Regarding the right hip, there are moderate degenerative changes about the femoral acetabular joint.   Radiology Read: Pelvic ring is intact. Degenerative changes of the hip joints are noted left slightly greater than right. No acute fracture is seen. No soft tissue abnormality is noted.  Assessment:  Left hip osteoarthritis- moderate to severe  Plan:  Patient was seen and examined in office today. We reviewed patient's history, examination, and imaging in detail. Based on information available for this encounter, patient symptomatic from his left hip osteoarthritis.  Patient also has concomitant lumbar pathology which is currently being evaluated by neurosurgery team.  We explained the natural course of his disease process.  In regards to his hip, patient has trialed numerous conservative treatment measures including Tylenol , ibuprofen, rest, ice and heat.  We also discussed structured physical therapy as a reasonable option.  He is currently attending physical therapy for his lumbar spine so he will address his hip during  these sessions as well.  Also cautioned the patient on the risks of taking NSAIDs regularly and advised to limit long-term usage.  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  We will plan on seeing patient back in 6-8 weeks after completion of physical therapy   All questions, concerns and comments were addressed to the best of my ability.  Follow-up: 6-8 weeks after physical therapy   Arlyss GEANNIE Schneider, DO Orthopedic Surgery & Sports Medicine [place]   This document was dictated using Dragon voice recognition software. A reasonable attempt at proof reading has been made to minimize errors.

## 2024-06-15 ENCOUNTER — Ambulatory Visit: Admitting: Physician Assistant

## 2024-06-15 ENCOUNTER — Ambulatory Visit

## 2024-06-15 ENCOUNTER — Encounter: Payer: Self-pay | Admitting: Physician Assistant

## 2024-06-15 VITALS — BP 170/98 | Ht 69.0 in | Wt 160.1 lb

## 2024-06-15 DIAGNOSIS — M5416 Radiculopathy, lumbar region: Secondary | ICD-10-CM | POA: Diagnosis not present

## 2024-06-15 DIAGNOSIS — M25552 Pain in left hip: Secondary | ICD-10-CM

## 2024-06-15 DIAGNOSIS — M1612 Unilateral primary osteoarthritis, left hip: Secondary | ICD-10-CM

## 2024-06-15 DIAGNOSIS — M4807 Spinal stenosis, lumbosacral region: Secondary | ICD-10-CM | POA: Diagnosis not present

## 2024-06-15 NOTE — Progress Notes (Signed)
 Referring Physician:  Johnson Morna FALCON, NP [address] [place],  [place] [address]  Primary Physician:  Johnson Morna FALCON, NP  History of Present Illness:  [name] is here today with a chief complaint of left low back and left leg pain.  Patient states the majority of his pain radiates to his left groin and sometimes down the front of his leg to his knee.  The pain that was radiating to his left groin has been quite consistent and bothersome to the patient.  He previously thought he had a hernia.  It is also associated with muscle spasms in his quad.  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  He has of his left hip pops often.  Denies any saddle anesthesia or issues with incontinence.  The numbness that he experiences in the left leg he says is chronic and is not new associated with the pain that he has been experiencing recently.  06/15/2024 Patient comes today for follow-up for left hip/left low back pain that radiates to his groin.  He has been undergoing physical therapy for the past 6 weeks which he does feel as though helps especially with the addition of his tizanidine .  The pain has decreased in frequency and he is also trying to limit some of his physical activity that exacerbates his pain.  He continue to have pain with rotation of his hip and with hip flexion.  No new weakness, numbness or tingling.    Duration: 07/2023 Bowel/Bladder Dysfunction: none  Conservative measures:  Physical therapy:  has not participated since 2004 Multimodal medical therapy including regular antiinflammatories: Prednisone, Tylenol  Injections: no epidural steroid injections  Past Surgery: no spine surgery  Daniel Warren has no symptoms of cervical myelopathy.  The symptoms are causing a significant impact on the patient's life.   Review of Systems:  A 10 point review of systems is negative, except  for the pertinent positives and negatives detailed in the HPI.  Past Medical History: Past Medical History:  Diagnosis Date   Anxiety    Depression    Erectile dysfunction    Hyperlipidemia    Hypertension    Stroke [facility])     Past Surgical History: Past Surgical History:  Procedure Laterality Date   NO PAST SURGERIES      Allergies: Allergies as of 06/15/2024 - Review Complete 04/28/2024  Allergen Reaction Noted   Percocet [oxycodone-acetaminophen ] Other (See Comments) 03/10/2016   Codeine Rash and Other (See Comments) 03/10/2016    Medications: Outpatient Encounter Medications as of 06/15/2024  Medication Sig   acetaminophen  (TYLENOL ) 500 MG tablet Take 500 mg by mouth every 6 (six) hours as needed.   aspirin  (GOODSENSE ASPIRIN ) 325 MG tablet Take 325 mg by mouth daily.   atorvastatin  (LIPITOR) 20 MG tablet Take 20 mg by mouth daily.   cetirizine (ZYRTEC ALLERGY) 10 MG tablet Take 10 mg by mouth daily.   FLUoxetine  (PROZAC ) 10 MG capsule Take 1 capsule (10 mg total) by mouth daily.   lisinopril -hydrochlorothiazide  (PRINZIDE ,ZESTORETIC ) 10-12.5 MG tablet Take 1 tablet by mouth daily.   tiZANidine  (ZANAFLEX ) 4 MG tablet Take 0.5-1 tablets (2-4 mg total) by mouth every 8 (eight) hours as needed for muscle spasms.   [DISCONTINUED] sildenafil (REVATIO) 20 MG tablet 1-3 tablets Orally Once daily as needed; Duration: 30 days   No facility-administered encounter medications on file as  of 06/15/2024.    Social History: Social History   Tobacco Use   Smoking status: Never   Smokeless tobacco: Current    Types: Chew  Vaping Use   Vaping status: Never Used  Substance Use Topics   Alcohol use: No   Drug use: No    Family Medical History: No family history on file.  Physical Examination: @VITALWITHPAIN @  General: Patient is well developed, well nourished, calm, collected, and in no apparent distress. Attention to examination is appropriate.  Psychiatric: Patient is  non-anxious.  Head:  Pupils equal, round, and reactive to light.  ENT:  Oral mucosa appears well hydrated.  Neck:   Supple.  Full range of motion.  Respiratory: Patient is breathing without any difficulty.  Extremities: No edema.  Vascular: Palpable dorsal pedal pulses.  Skin:   On exposed skin, there are no abnormal skin lesions.  NEUROLOGICAL:     Awake, alert, oriented to person, place, and time.  Speech is clear and fluent. Fund of knowledge is appropriate.   Cranial Nerves: Pupils equal round and reactive to light.  Facial tone is symmetric.   ROM of spine: Some tenderness to palpation of lumbar paraspinals.    Strength:  Side Iliopsoas Quads Hamstring PF DF EHL  R 5 5 5 5 5 5   L 4- 5 5 5  4+ 5      Severe pain with internal rotation and external rotation of left hip     Reflexes are 3+ in bilateral patella.  Crossed adductor.  Hoffman's is absent.  Clonus is not present.  Toes are down-going.  Bilateral upper and lower extremity sensation is intact to light touch.    Patient difficulty with tandem gait, slight limp.  Medical Decision Making  Imaging: EXAM: MRI LUMBAR SPINE WITHOUT CONTRAST   TECHNIQUE: Multiplanar, multisequence MR imaging of the lumbar spine was performed. No intravenous contrast was administered.   COMPARISON:  None Available.   FINDINGS: Segmentation: Standard.   Alignment:  Physiologic lumbar alignment is maintained.   Vertebrae: Degenerative endplate marrow changes at a few levels. No compression fractures.   Conus medullaris and cauda equina: The conus medullaris terminates at the level of L1-L2. The distal spinal cord signal intensity is normal.   Paraspinal and other soft tissues: The visualized abdomen and pelvis show no soft tissue abnormality. The visualized aorta is normal.   Disc levels:   L1-L2: Mild disc bulge. Mild bilateral facet arthropathy. No neuroforaminal stenosis. No spinal canal stenosis.   L2-L3:  Mild disc bulge. Mild bilateral facet arthropathy. No neuroforaminal stenosis. No spinal canal stenosis.   L3-L4: Disc bulge. Mild bilateral facet arthropathy. Moderate right and mild left neuroforaminal stenosis. Mild spinal canal stenosis.   L4-L5: Disc bulge. Moderate bilateral facet arthropathy. Mild left neuroforaminal stenosis. Mild spinal canal stenosis.   L5-S1: Disc bulge. Mild bilateral facet arthropathy. No neuroforaminal stenosis. No spinal canal stenosis.   IMPRESSION: 1. Mild canal stenosis at L3-L4 and L4-L5 secondary to disc bulging and facet arthropathy. 2. Moderate right foraminal stenosis at L3-L4.  Left hip x-ray reviewed which does show moderate osteoarthritis seen in his joint with decreased joint space.  I have personally reviewed the images and agree with the above interpretation.  Assessment and Plan: Daniel Warren is a pleasant 63 y.o. male with left low back/left hip pain primarily radiating to his left groin as well as down the anterior aspect of his thigh down to his knee.  This is also associated with muscle spasms  in his quad.  He continues to have pain with left hip flexion and internal rotation of his left hip.  This has improved somewhat over his past 6 weeks of physical therapy.  He continues to do his exercises at home and take Tylenol  and tizanidine  as needed.  On examination, no new weakness, but again continues to have pain with internal rotation of his hip and hip flexion.  This does not fit a dermatomal pattern of where he has increased stenosis in his lumbar spine.  With the osteoarthritis seen on his left hip x-rays and where his pain radiates I do believe that this is the main source of his pain.  I advised the patient to continue physical therapy, I am happy to place a new referral if they needed.  I have also counseled patient on making sure that he follows up with orthopedic surgery and the plan that they lay out for him.  I am happy to be available to  him on an as-needed basis.    Thank you for involving me in the care of this patient.    Lyle Decamp, PA-C Dept. of Neurosurgery

## 2024-06-15 NOTE — Progress Notes (Signed)
 Orthopedic Follow-Up Note  Left hip pain   SUBJECTIVE:   Daniel Warren is a 63 y.o. year old who presents for follow-up of left hip pain.  Patient last seen by our office on 04/28/2024.  Patient reports continued left hip pain.  Located in groin.  Describes as deep ache.  Associated pain in low back, lateral aspect of left hip, anterior aspect of left thigh and posterior aspect of left thigh.  Patient reports associated left lower extremity paresthesias/numbness.  Patient has been attending physical therapy at [facility] in [place] for the past 6 weeks.  Reports moderate symptom improvement.  Reports strengthening, increased range of motion, and decrease in pain.  Patient continues to state that pain is most severe with left hip flexion such as when changing gears during driving and from the vibration when riding the tractor.  Patient applying ice and using over-the-counter Tylenol  for symptom improvement.  Patient is a visual merchandiser, primarily in the feed business, and is currently in harvest season.  Patient currently being managed by Lyle Butters PA-C with [facility] health neurosurgery at [facility].  Most recent appointment today 06/15/2024, right before orthopedic appointment.  Patient to continue with physical therapy and prescription tizanidine  as needed.     Past Medical History:  Diagnosis Date   Anxiety    Depression    Erectile dysfunction    Hyperlipidemia    Hypertension    Stroke [facility])    Past Surgical History:  Procedure Laterality Date   NO PAST SURGERIES      Current Outpatient Medications:    acetaminophen  (TYLENOL ) 500 MG tablet, Take 500 mg by mouth every 6 (six) hours as needed., Disp: , Rfl:    aspirin  (GOODSENSE ASPIRIN ) 325 MG tablet, Take 325 mg by mouth daily., Disp: , Rfl:    atorvastatin  (LIPITOR) 20 MG tablet, Take 20 mg by mouth daily., Disp: , Rfl:    cetirizine (ZYRTEC ALLERGY) 10 MG tablet, Take 10 mg by mouth daily., Disp: , Rfl:    FLUoxetine  (PROZAC ) 10 MG capsule,  Take 1 capsule (10 mg total) by mouth daily., Disp: 30 capsule, Rfl: 0   lisinopril -hydrochlorothiazide  (PRINZIDE ,ZESTORETIC ) 10-12.5 MG tablet, Take 1 tablet by mouth daily., Disp: , Rfl:    tiZANidine  (ZANAFLEX ) 4 MG tablet, Take 0.5-1 tablets (2-4 mg total) by mouth every 8 (eight) hours as needed for muscle spasms., Disp: 90 tablet, Rfl: 1 Allergies  Allergen Reactions   Percocet [Oxycodone-Acetaminophen ] Other (See Comments)    hallucinations   Codeine Rash and Other (See Comments)    hallucinations   Social History   Socioeconomic History   Marital status: Married    Spouse name: Not on file   Number of children: Not on file   Years of education: Not on file   Highest education level: Not on file  Occupational History   Not on file  Tobacco Use   Smoking status: Never   Smokeless tobacco: Current    Types: Chew  Vaping Use   Vaping status: Never Used  Substance and Sexual Activity   Alcohol use: No   Drug use: No   Sexual activity: Not on file  Other Topics Concern   Not on file  Social History Narrative   Not on file   Social Drivers of Health   Financial Resource Strain: Not on file  Food Insecurity: Not on file  Transportation Needs: Not on file  Physical Activity: Not on file  Stress: Not on file  Social Connections: Not on file  Intimate Partner Violence: Not on file   No family history on file.   ROS: A review of systems was performed and is negative unless stated above in HPI    OBJECTIVE:    Constitutional:   The patient is alert and oriented x 3, appears to be stated age and in no distress.   Orthopaedic Examination:  Hip Examination (focused):   LEFT  ROM (degrees): Flexion-Extension 0-130    Abduction 40    Adduction 15    IR 25    ER 40   Palpation (pain): Anterior negative   Iliopsoas negative   Greater troch positive   ASIS negative   AIIS negative   Posterior positive   Adductors positive  Special Tests: FADIR positive    FABER positive   Log Roll positive   Ober's positive  Other: Hip flexion strength 5/5   Hip adductor strength  5/5   Hip abductor strength 5/5    Vascular/Lymphatic: 2+ dorsalis pedis/posterior tibialis pulses,  foot warm and well perfused Neurologic: Sensation intact to light touch to Superficial peroneal/Deep peroneal/Tibial/Sural/Saphenous nerves. Patient reports subjective decreased sensation diffusely over left lower extremity - does not follow specific dermatome pattern.    IMAGING:   Previous Imaging  Lumbar Spine x-ray on 04/20/2024 FINDINGS: Five lumbar type vertebral bodies are well visualized. Vertebral body height is well maintained. Mild scoliosis concave to the left is noted. Disc space narrowing at L4-5 and L5-S1 is noted. Mild anterolisthesis of L4 on L5 is noted. No significant increase on flexion is seen. No other instability is noted.  Left Hip x-ray on 04/20/2024 FINDINGS: Pelvic ring is intact. Degenerative changes of the hip joints are noted left slightly greater than right. No acute fracture is seen. No soft tissue abnormality is noted.  Lumbar Spine MRI on 03/21/2024 IMPRESSION: 1. Mild canal stenosis at L3-L4 and L4-L5 secondary to disc bulging and facet arthropathy. 2. Moderate right foraminal stenosis at L3-L4.      ASSESSMENT:  Left hip osteoarthritis Left hip trochanteric bursitis Low back pain     PLAN:   Patient was seen in office today for follow up of left hip pain. Since last encounter, patient reports mild improvement, secondary to physical therapy and activity modification.   1. Progressing well since last encounter in office  2. Patient with significant improvement status post 6-week course of physical therapy.  Patient interested in continuing.  Prescribed continuation of physical therapy for additional 6 to 8 weeks.  Patient also interested in considering cortisone injection.  Discussed options of lumbar epidural steroid  injection, left hip intra-articular injection, or trochanteric bursa injection.  Mutual discussion agreed with left hip intra-articular injection.  Patient referred to Dr. Selinda Ku with [facility] for consideration of injection.  Patient advised to continue over-the-counter Tylenol  for symptom relief. 3. Follow-up in clinic in 6 weeks for re-evaluation 4. All questions and concerns were answered to the best of my ability.  Patient can call any time with further concerns.   Arlyss Schneider, DO Orthopedic Surgery & Sports Medicine [facility]

## 2024-06-15 NOTE — Patient Instructions (Signed)

## 2024-06-20 ENCOUNTER — Ambulatory Visit: Admitting: Family Medicine

## 2024-06-20 ENCOUNTER — Encounter: Payer: Self-pay | Admitting: Family Medicine

## 2024-06-20 ENCOUNTER — Other Ambulatory Visit (INDEPENDENT_AMBULATORY_CARE_PROVIDER_SITE_OTHER): Payer: Self-pay | Admitting: Radiology

## 2024-06-20 VITALS — BP 140/90 | HR 74 | Ht 69.0 in | Wt 159.0 lb

## 2024-06-20 DIAGNOSIS — M1612 Unilateral primary osteoarthritis, left hip: Secondary | ICD-10-CM | POA: Insufficient documentation

## 2024-06-20 MED ORDER — TRIAMCINOLONE ACETONIDE 40 MG/ML IJ SUSP
40.0000 mg | Freq: Once | INTRAMUSCULAR | Status: AC
  Administered 2024-06-20: 40 mg via INTRAMUSCULAR

## 2024-06-20 NOTE — Assessment & Plan Note (Signed)
 History of Present Illness Daniel Warren is a 63 year old male who presents with chronic left hip pain and functional limitations.  Left hip pain and radicular symptoms - Chronic left hip pain present for approximately eleven months - Pain radiates from the left hip down the leg, extending below the knee - Initial pain described as 'twinges', persistent despite interventions - No history of cortisone injections for the hip - MRI of the hip performed in July - Hernia evaluation in January was negative - Prednisone tablets provided temporary relief; pain recurred after discontinuation  Lumbar radiculopathy and prior spinal pathology - History of pinched nerve and bulging disc at L4 on the left side, diagnosed 21 years ago - Suspects favoring one side due to pain has contributed to current symptoms  Physical therapy and rehabilitation - Completed six weeks of physical therapy - Physical therapy regimen includes traction and decompression techniques - Some improvement in pain and mobility with therapy - Plans to continue additional physical therapy sessions starting tomorrow  Functional limitations and impact on activities of daily living - 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 Resisted iliopsoas testing elicits anterior pain. Positive Kemp's test localizing left and midline. Negative modified straight leg raise.  Assessment and Plan Primary osteoarthritis of the left hip Chronic left hip pain with radiating symptoms, possible lumbar involvement. X-rays show joint space narrowing and subchondral sclerosis. Positive FADIR and iliopsoas tests indicate hip joint involvement. - Administered hip joint injection with lidocaine, bupivacaine, and steroid under ultrasound guidance. - Advised  relative rest for two days post-injection. - Recommended ice application to the hip for 20 minutes twice daily. - Continue physical therapy to improve joint function and flexibility. - Coordinate with spine specialist for potential spine injection if hip injection provides partial relief.  Low back pain with lumbar spondylosis Chronic low back pain with lumbar spondylosis at L4-L5 and S1. Pain radiates down the leg, suggesting nerve involvement. Positive Kemp's test, negative modified straight leg raise test. - Continue physical therapy focusing on back and hip. - Coordinate with spine specialist for potential spine injection if hip injection provides partial relief.

## 2024-06-20 NOTE — Patient Instructions (Signed)
 VISIT SUMMARY:  Today, we addressed your chronic left hip pain and low back pain. You received a hip joint injection to help with the pain, and we discussed continuing physical therapy and coordinating with a spine specialist for further treatment.  YOUR PLAN:  PRIMARY OSTEOARTHRITIS OF THE LEFT HIP: You have chronic left hip pain with radiating symptoms, possibly involving your lower back. X-rays show joint space narrowing and other changes in the hip joint. -You received a hip joint injection with lidocaine, bupivacaine, and steroid under ultrasound guidance. -Rest for two days after the injection. -Apply ice to your hip for 20 minutes twice daily. -Continue physical therapy to improve joint function and flexibility. -Continue with your spine specialist for further evaluation and management.  LOW BACK PAIN WITH LUMBAR SPONDYLOSIS: You have chronic low back pain with lumbar spondylosis at L4-L5 and S1, with pain radiating down your leg, suggesting nerve involvement. -Continue physical therapy focusing on your back and hip. -Continue with your spine specialist for further evaluation and management.

## 2024-06-20 NOTE — Progress Notes (Signed)
 Primary Care / Sports Medicine Office Visit  Patient Information:  Patient ID: Daniel Warren, male DOB: 1960-09-29 Age: 63 y.o. MRN: [id number]   Daniel Warren is a pleasant 63 y.o. male presenting with the following:  Chief Complaint  Patient presents with   Hip Pain    Left hip pain x 11 months. Aggravating factors lifting, riding lawn mower, going from sit to stand. He has popping and catching in his left hip. Patient takes arthritis strength Tylenol  and Tizanidine  PRN. He had xray of his left hip. He is looking to get a cortisone injection for today's visit.     Vitals:   06/20/24 0913 06/20/24 0923  BP: (!) 150/92 (!) 140/90  Pulse: 74   SpO2: 97%    Vitals:   06/20/24 0913  Weight: 159 lb (72.1 kg)  Height: 5' 9 (1.753 m)   Body mass index is 23.48 kg/m.  No results found.   Discussed the use of AI scribe software for clinical note transcription with the patient, who gave verbal consent to proceed.   Independent interpretation of notes and tests performed by another provider:   None  Procedures performed:   Procedure:  Injection of Left hip under ultrasound guidance. Ultrasound guidance utilized for anterior approach to femoral head-neck junction Samsung HS60 device utilized with permanent recording / reporting. Verbal informed consent obtained and verified. Skin prepped in a sterile fashion. Ethyl chloride for topical local analgesia.  Completed without difficulty and tolerated well. Medication: triamcinolone acetonide 40 mg/mL suspension for injection 1 mL total and 2 mL lidocaine 1% without epinephrine utilized for needle placement anesthetic Advised to contact for fevers/chills, erythema, induration, drainage, or persistent bleeding.   Pertinent History, Exam, Impression, and Recommendations:   Problem List Items Addressed This Visit     Primary osteoarthritis of left hip - Primary   History of Present Illness Daniel Warren is a 63 year old  male who presents with chronic left hip pain and functional limitations.  Left hip pain and radicular symptoms - Chronic left hip pain present for approximately eleven months - Pain radiates from the left hip down the leg, extending below the knee - Initial pain described as 'twinges', persistent despite interventions - No history of cortisone injections for the hip - MRI of the hip performed in July - Hernia evaluation in January was negative - Prednisone tablets provided temporary relief; pain recurred after discontinuation  Lumbar radiculopathy and prior spinal pathology - History of pinched nerve and bulging disc at L4 on the left side, diagnosed 21 years ago - Suspects favoring one side due to pain has contributed to current symptoms  Physical therapy and rehabilitation - Completed six weeks of physical therapy - Physical therapy regimen includes traction and decompression techniques - Some improvement in pain and mobility with therapy - Plans to continue additional physical therapy sessions starting tomorrow  Functional limitations and impact on activities of daily living - 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 Resisted iliopsoas testing elicits anterior pain. Positive Kemp's test localizing left and midline. Negative modified straight leg raise.  Assessment and Plan Primary osteoarthritis of the left hip Chronic left hip pain with radiating symptoms, possible lumbar involvement. X-rays show joint space narrowing and subchondral sclerosis. Positive FADIR  and iliopsoas tests indicate hip joint involvement. - Administered hip joint injection with lidocaine, bupivacaine, and steroid under ultrasound guidance. - Advised relative rest for two days post-injection. - Recommended ice  application to the hip for 20 minutes twice daily. - Continue physical therapy to improve joint function and flexibility. - Coordinate with spine specialist for potential spine injection if hip injection provides partial relief.  Low back pain with lumbar spondylosis Chronic low back pain with lumbar spondylosis at L4-L5 and S1. Pain radiates down the leg, suggesting nerve involvement. Positive Kemp's test, negative modified straight leg raise test. - Continue physical therapy focusing on back and hip. - Coordinate with spine specialist for potential spine injection if hip injection provides partial relief.      Relevant Orders   US  LIMITED JOINT SPACE STRUCTURES LOW LEFT     Orders & Medications Medications:  Meds ordered this encounter  Medications   triamcinolone acetonide (KENALOG-40) injection 40 mg   Orders Placed This Encounter  Procedures   US  LIMITED JOINT SPACE STRUCTURES LOW LEFT     Return if symptoms worsen or fail to improve.     Selinda JINNY Ku, MD, [facility]   Primary Care Sports Medicine Primary Care and Sports Medicine at [facility]

## 2024-07-27 ENCOUNTER — Ambulatory Visit

## 2024-07-27 DIAGNOSIS — M1612 Unilateral primary osteoarthritis, left hip: Secondary | ICD-10-CM | POA: Diagnosis not present

## 2024-07-27 NOTE — Progress Notes (Signed)
 Orthopedic Follow-Up Note  6-week follow-up left hip osteoarthritis    SUBJECTIVE:   Daniel Warren is a 63 y.o. year old  who presents for follow up of left hip osteoarthritis.  Patient was last seen in our office on 06/15/2024.  At that appointment, we had recommended continued physical therapy as patient was seeing significant benefit.  We also referred patient to Dr. Alvia for left hip intra-articular steroid injection.  Patient was seen by Dr. Alvia on 11/10 where he received the left hip steroid injection.  He reports after 1 week, he noticed 75% improvement in his symptoms, specifically left groin pain.  He reports symptom relief continues through today's appointment.  He denies any new complaints today.     Past Medical History:  Diagnosis Date   Anxiety    Depression    Erectile dysfunction    Hyperlipidemia    Hypertension    Stroke [facility])    Past Surgical History:  Procedure Laterality Date   NO PAST SURGERIES     Current Medications[1] Allergies[2] Social History   Socioeconomic History   Marital status: Married    Spouse name: Not on file   Number of children: Not on file   Years of education: Not on file   Highest education level: Not on file  Occupational History   Not on file  Tobacco Use   Smoking status: Never   Smokeless tobacco: Current    Types: Chew  Vaping Use   Vaping status: Never Used  Substance and Sexual Activity   Alcohol use: No   Drug use: No   Sexual activity: Not on file  Other Topics Concern   Not on file  Social History Narrative   Not on file   Social Drivers of Health   Tobacco Use: High Risk (06/20/2024)   Patient History    Smoking Tobacco Use: Never    Smokeless Tobacco Use: Current    Passive Exposure: Not on file  Financial Resource Strain: Not on file  Food Insecurity: Not on file  Transportation Needs: Not on file  Physical Activity: Not on file  Stress: Not on file  Social Connections: Not on file  Intimate  Partner Violence: Not on file  Depression (EYV7-0): Low Risk (06/20/2024)   Depression (PHQ2-9)    PHQ-2 Score: 0  Alcohol Screen: Not on file  Housing: Not on file  Utilities: Not on file  Health Literacy: Not on file   No family history on file.   ROS: A review of systems was performed and is negative unless stated above in HPI    OBJECTIVE:    Constitutional:   The patient is alert and oriented x 3, appears to be stated age and in no distress.   Orthopaedic Examination:   Hip Examination (focused):     LEFT  ROM (degrees): Extension- Flexion 15-130    Abduction 40     Adduction 15     IR 25     ER 40   Palpation (pain): Anterior negative    Iliopsoas negative    Greater troch negative    ASIS negative    AIIS negative    Posterior positive    Adductors positive  Special Tests: FADIR positive    FABER positive    Log Roll negative    Ober's negative  Other: Hip flexion strength 5/5    Hip adductor strength  5/5    Hip abductor strength 5/5      Vascular/Lymphatic: Left lower  extremity warm and well-perfused Neurologic: Sensation intact to light touch to Superficial peroneal/Deep peroneal/Tibial/Sural/Saphenous nerves. Patient reports subjective decreased sensation diffusely over left lower extremity - does not follow specific dermatome pattern.   IMAGING:   Xrays: No new imaging available for review today    ASSESSMENT:  Left hip osteoarthritis- symptoms improved with recent intra-articular steroid injection     PLAN:   Patient was seen in office today for follow up of left hip osteoarthritis. Since last encounter, patient reports significant improvement in symptoms following recent intra-articular steroid injection performed by Dr. Alvia.  He is happy to report 75% improvement in left groin pain.  Patient has also been undergoing formal physical therapy which he believes has been helping significantly as well.  He would like to continue home  exercises and physical therapy at this time.  Now that patient has been established with Dr. Alvia, he plans on following up every 3-4 months as needed for consideration of hip steroid injections.  He is not interested in consideration of left total hip arthroplasty at this time.  We informed patient that if his symptoms worsen despite conservative treatment measures listed above, he may call the office and schedule appointment with Dr. Mariah for consideration of left total hip arthroplasty.  He will continue follow-up with neurosurgery as needed as well.  Patient will follow-up in clinic PRN  All questions and concerns were answered to the best of my ability.  Patient can call any time with further concerns.  I discussed with the patient today that I will be transitioning out of my role within the near future. In order to provide appropriate continuity of care, we offered the patient options for follow up regarding their orthopedic concerns. Patient has chosen to follow up with [facility].  Patient may reach out to our office if there are any difficulties in scheduling follow up care. The patient understands who to contact for future orthopedic concerns and has contact information for the receiving practice.   Arlyss Schneider, DO Orthopedic Surgery & Sports Medicine [facility]      [1]  Current Outpatient Medications:    acetaminophen  (TYLENOL ) 500 MG tablet, Take 500 mg by mouth every 6 (six) hours as needed., Disp: , Rfl:    aspirin  (GOODSENSE ASPIRIN ) 325 MG tablet, Take 325 mg by mouth daily., Disp: , Rfl:    atorvastatin  (LIPITOR) 20 MG tablet, Take 20 mg by mouth daily., Disp: , Rfl:    cetirizine (ZYRTEC ALLERGY) 10 MG tablet, Take 10 mg by mouth daily., Disp: , Rfl:    FLUoxetine  (PROZAC ) 10 MG capsule, Take 1 capsule (10 mg total) by mouth daily., Disp: 30 capsule, Rfl: 0   lisinopril -hydrochlorothiazide  (PRINZIDE ,ZESTORETIC ) 10-12.5 MG tablet, Take 1 tablet by mouth  daily., Disp: , Rfl:    tiZANidine  (ZANAFLEX ) 4 MG tablet, Take 0.5-1 tablets (2-4 mg total) by mouth every 8 (eight) hours as needed for muscle spasms., Disp: 90 tablet, Rfl: 1 [2]  Allergies Allergen Reactions   Percocet [Oxycodone-Acetaminophen ] Other (See Comments)    hallucinations   Codeine Rash and Other (See Comments)    hallucinations
